# Patient Record
Sex: Male | Born: 1985 | Race: Black or African American | Hispanic: No | Smoking: Current every day smoker
Health system: Southern US, Community
[De-identification: ages and names within clinical notes are randomized; demographics above are authoritative.]

---
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
## 2013-11-06 ENCOUNTER — Encounter (HOSPITAL_COMMUNITY): Payer: Self-pay | Admitting: Emergency Medicine

## 2013-11-06 ENCOUNTER — Emergency Department (HOSPITAL_COMMUNITY)
Admission: EM | Admit: 2013-11-06 | Discharge: 2013-11-06 | Discharge status: Against Medical Advice | Payer: Self-pay | Attending: Emergency Medicine | Admitting: Emergency Medicine

## 2013-11-06 DIAGNOSIS — F17200 Nicotine dependence, unspecified, uncomplicated: Secondary | ICD-10-CM | POA: Insufficient documentation

## 2013-11-06 DIAGNOSIS — H579 Unspecified disorder of eye and adnexa: Secondary | ICD-10-CM | POA: Insufficient documentation

## 2013-11-06 DIAGNOSIS — Z5329 Procedure and treatment not carried out because of patient's decision for other reasons: Secondary | ICD-10-CM

## 2013-11-06 DIAGNOSIS — Z5320 Procedure and treatment not carried out because of patient's decision for unspecified reasons: Secondary | ICD-10-CM

## 2013-11-06 DIAGNOSIS — J029 Acute pharyngitis, unspecified: Secondary | ICD-10-CM | POA: Insufficient documentation

## 2013-11-06 DIAGNOSIS — R059 Cough, unspecified: Secondary | ICD-10-CM | POA: Insufficient documentation

## 2013-11-06 DIAGNOSIS — R05 Cough: Secondary | ICD-10-CM | POA: Insufficient documentation

## 2013-11-06 MED ORDER — TETRACAINE HCL 0.5 % OP SOLN
2.0000 [drp] | Freq: Once | OPHTHALMIC | Status: AC
  Administered 2013-11-06: 2 [drp] via OPHTHALMIC
  Filled 2013-11-06: qty 2

## 2013-11-06 MED ORDER — FLUORESCEIN SODIUM 1 MG OP STRP
1.0000 | ORAL_STRIP | Freq: Once | OPHTHALMIC | Status: AC
  Administered 2013-11-06: 1 via OPHTHALMIC
  Filled 2013-11-06: qty 1

## 2013-11-06 NOTE — ED Notes (Signed)
Pt states sore throat x 3 days, productive cough. Pt noticed some exudate from left eye and wanted that checked out also. He states his kids have pink eye.

## 2013-11-06 NOTE — ED Notes (Signed)
Eye gtt instilled at left eye .

## 2013-11-06 NOTE — ED Notes (Signed)
Pt. informed nurse that he has to leave " 2 kids at home and snowing ..." . PA notified . Pt. Did not sign AMA form .

## 2013-11-07 NOTE — ED Provider Notes (Signed)
Patient left without being evaluated  Randall Munchobert [name], MD 11/07/13 [phone]

## 2013-11-07 NOTE — ED Provider Notes (Signed)
Patient left prior to being seen.    Rudene AndaJacob Gray Ameliana Brashear, PA-C 11/07/13 [phone]

## 2014-07-02 ENCOUNTER — Emergency Department (INDEPENDENT_AMBULATORY_CARE_PROVIDER_SITE_OTHER)
Admission: EM | Admit: 2014-07-02 | Discharge: 2014-07-02 | Disposition: A | Discharge status: Home / Self Care | Payer: Self-pay | Source: Home / Self Care | Attending: Emergency Medicine | Admitting: Emergency Medicine

## 2014-07-02 ENCOUNTER — Encounter (HOSPITAL_COMMUNITY): Payer: Self-pay | Admitting: Emergency Medicine

## 2014-07-02 DIAGNOSIS — R69 Illness, unspecified: Principal | ICD-10-CM

## 2014-07-02 DIAGNOSIS — J111 Influenza due to unidentified influenza virus with other respiratory manifestations: Secondary | ICD-10-CM

## 2014-07-02 LAB — POCT RAPID STREP A: STREPTOCOCCUS, GROUP A SCREEN (DIRECT): NEGATIVE

## 2014-07-02 MED ORDER — IPRATROPIUM BROMIDE 0.06 % NA SOLN: 2.0000 | Freq: Four times a day (QID) | NASAL | Status: DC

## 2014-07-02 MED ORDER — TRAMADOL HCL 50 MG PO TABS: 100.0000 mg | ORAL_TABLET | Freq: Three times a day (TID) | ORAL | Status: DC | PRN

## 2014-07-02 MED ORDER — GUAIFENESIN-CODEINE 100-10 MG/5ML PO SYRP: 5.0000 mL | ORAL_SOLUTION | Freq: Three times a day (TID) | ORAL | Status: DC | PRN

## 2014-07-02 NOTE — ED Provider Notes (Signed)
  Chief Complaint   URI   History of Present Illness   Randall Raymond is a 28 year old male who's had a three-day history of dry cough, chest tightness, chest pain with inspiration, sore throat, chills, sweats, subjective fever, nasal congestion and clear rhinorrhea, headache, and sinus pressure. He denies any earache or GI symptoms. His mouth is so dry. His daughter had similar symptoms.  Review of Systems   Other than as noted above, the patient denies any of the following symptoms: Systemic:  No fevers, chills, sweats, or myalgias. Eye:  No redness or discharge. ENT:  No ear pain, headache, nasal congestion, drainage, sinus pressure, or sore throat. Neck:  No neck pain, stiffness, or swollen glands. Lungs:  No cough, sputum production, hemoptysis, wheezing, chest tightness, shortness of breath or chest pain. GI:  No abdominal pain, nausea, vomiting or diarrhea.  PMFSH   Past medical history, family history, social history, meds, and allergies were reviewed.   Physical exam   Vital signs:  BP 114/83  Pulse 86  Temp(Src) 99.4 F (37.4 C) (Oral)  Resp 20  SpO2 97% General:  Alert and oriented.  In no distress.  Skin warm and dry. Eye:  No conjunctival injection or drainage. Lids were normal. ENT:  TMs and canals were normal, without erythema or inflammation.  Nasal mucosa was clear and uncongested, without drainage.  Mucous membranes were moist.  Tonsils were enlarged and red but without exudate.  There were no oral ulcerations or lesions. Neck:  Supple, no adenopathy, tenderness or mass. Lungs:  No respiratory distress.  Lungs were clear to auscultation, without wheezes, rales or rhonchi.  Breath sounds were clear and equal bilaterally.  Heart:  Regular rhythm, without gallops, murmers or rubs. Skin:  Clear, warm, and dry, without rash or lesions.  Labs   Results for orders placed during the hospital encounter of 07/02/14  POCT RAPID STREP A ([facility] ONLY)      Result  Value Ref Range   Streptococcus, Group A Screen (Direct) NEGATIVE  NEGATIVE    Assessment     The encounter diagnosis was Influenza-like illness.  Plan    1.  Meds:  The following meds were prescribed:   New Prescriptions   GUAIFENESIN-CODEINE (ROBITUSSIN AC) 100-10 MG/5ML SYRUP    Take 5 mLs by mouth 3 (three) times daily as needed for cough.   IPRATROPIUM (ATROVENT) 0.06 % NASAL SPRAY    Place 2 sprays into both nostrils 4 (four) times daily.   TRAMADOL (ULTRAM) 50 MG TABLET    Take 2 tablets (100 mg total) by mouth every 8 (eight) hours as needed.    2.  Patient Education/Counseling:  The patient was given appropriate handouts, self care instructions, and instructed in symptomatic relief.  Instructed to get extra fluids and extra rest.    3.  Follow up:  The patient was told to follow up here if no better in 3 to 4 days, or sooner if becoming worse in any way, and given some red flag symptoms such as increasing fever, difficulty breathing, chest pain, or persistent vomiting which would prompt immediate return.       Reuben Likesavid C Keyli Duross, MD 07/02/14 [phone]

## 2014-07-02 NOTE — ED Notes (Signed)
C/o 3 day duration of cough, congestion, discomfort in ribs . "coughing up a lung every time he smokes"

## 2014-07-02 NOTE — Discharge Instructions (Signed)

## 2014-07-03 LAB — CULTURE, GROUP A STREP

## 2014-07-04 ENCOUNTER — Telehealth (HOSPITAL_COMMUNITY): Payer: Self-pay | Admitting: Emergency Medicine

## 2014-07-04 MED ORDER — AMOXICILLIN 500 MG PO CAPS: 500.0000 mg | ORAL_CAPSULE | Freq: Three times a day (TID) | ORAL | Status: DC

## 2014-07-04 NOTE — ED Notes (Signed)
His strep culture was positive for Strep pyogenes.  He was not treated.  Prescription for Amoxicillin 500 mg, #30, 1 TID e-scribed to [facility].  Please call patient and inform him of result.  Make sure he knows to finish up entire prescription of antibiotics and to take infectious precautions.  Randall Raymond C Chenoah Mcnally, MD 07/04/14 [phone]

## 2014-07-05 ENCOUNTER — Telehealth (HOSPITAL_COMMUNITY): Payer: Self-pay | Admitting: *Deleted

## 2014-07-05 NOTE — ED Notes (Addendum)
Throat culture: Group A Strep (S. Pyogenes).  Dr. Lorenz CoasterKeller e-prescribed Amoxicillin.  I called pt. and left a message to call. Call 1. Vassie MoselleYork, Adyan Palau M 07/05/2014 I called pt.  Pt. verified x 2 and given results. Pt.told he needs Amoxicillin for strep throat and told where to pick up his Rx.  Pt. instructed to finish all of the medication and comeback if not better.  Pt. voiced understanding. Vassie MoselleYork, Darcy Barbara M 07/08/2014

## 2014-11-10 ENCOUNTER — Encounter (HOSPITAL_COMMUNITY): Payer: Self-pay | Admitting: *Deleted

## 2014-11-10 ENCOUNTER — Emergency Department (HOSPITAL_COMMUNITY)
Admission: EM | Admit: 2014-11-10 | Discharge: 2014-11-10 | Discharge status: Against Medical Advice | Payer: Self-pay | Attending: Emergency Medicine | Admitting: Emergency Medicine

## 2014-11-10 DIAGNOSIS — Y9389 Activity, other specified: Secondary | ICD-10-CM | POA: Insufficient documentation

## 2014-11-10 DIAGNOSIS — Z720 Tobacco use: Secondary | ICD-10-CM | POA: Insufficient documentation

## 2014-11-10 DIAGNOSIS — R1011 Right upper quadrant pain: Secondary | ICD-10-CM | POA: Insufficient documentation

## 2014-11-10 DIAGNOSIS — Y9289 Other specified places as the place of occurrence of the external cause: Secondary | ICD-10-CM | POA: Insufficient documentation

## 2014-11-10 DIAGNOSIS — R0602 Shortness of breath: Secondary | ICD-10-CM | POA: Insufficient documentation

## 2014-11-10 DIAGNOSIS — K1379 Other lesions of oral mucosa: Secondary | ICD-10-CM | POA: Insufficient documentation

## 2014-11-10 DIAGNOSIS — Y998 Other external cause status: Secondary | ICD-10-CM | POA: Insufficient documentation

## 2014-11-10 DIAGNOSIS — S59902A Unspecified injury of left elbow, initial encounter: Secondary | ICD-10-CM | POA: Insufficient documentation

## 2014-11-10 DIAGNOSIS — S6992XA Unspecified injury of left wrist, hand and finger(s), initial encounter: Secondary | ICD-10-CM | POA: Insufficient documentation

## 2014-11-10 DIAGNOSIS — S299XXA Unspecified injury of thorax, initial encounter: Secondary | ICD-10-CM | POA: Insufficient documentation

## 2014-11-10 DIAGNOSIS — W1830XA Fall on same level, unspecified, initial encounter: Secondary | ICD-10-CM | POA: Insufficient documentation

## 2014-11-10 LAB — CBC WITH DIFFERENTIAL/PLATELET
Basophils Absolute: 0 10*3/uL (ref 0.0–0.1)
Basophils Relative: 1 % (ref 0–1)
Eosinophils Absolute: 0.2 10*3/uL (ref 0.0–0.7)
Eosinophils Relative: 3 % (ref 0–5)
HCT: 43.5 % (ref 39.0–52.0)
Hemoglobin: 15.2 g/dL (ref 13.0–17.0)
Lymphocytes Relative: 31 % (ref 12–46)
Lymphs Abs: 1.9 10*3/uL (ref 0.7–4.0)
MCH: 30.4 pg (ref 26.0–34.0)
MCHC: 34.9 g/dL (ref 30.0–36.0)
MCV: 87 fL (ref 78.0–100.0)
MONOS PCT: 12 % (ref 3–12)
Monocytes Absolute: 0.7 10*3/uL (ref 0.1–1.0)
NEUTROS ABS: 3.3 10*3/uL (ref 1.7–7.7)
NEUTROS PCT: 55 % (ref 43–77)
PLATELETS: 277 10*3/uL (ref 150–400)
RBC: 5 MIL/uL (ref 4.22–5.81)
RDW: 13.4 % (ref 11.5–15.5)
WBC: 6.1 10*3/uL (ref 4.0–10.5)

## 2014-11-10 LAB — COMPREHENSIVE METABOLIC PANEL
ALT: 41 U/L (ref 0–53)
AST: 74 U/L — ABNORMAL HIGH (ref 0–37)
Albumin: 4.7 g/dL (ref 3.5–5.2)
Alkaline Phosphatase: 73 U/L (ref 39–117)
Anion gap: 8 (ref 5–15)
BUN: 13 mg/dL (ref 6–23)
CO2: 25 mmol/L (ref 19–32)
Calcium: 9.3 mg/dL (ref 8.4–10.5)
Chloride: 107 mmol/L (ref 96–112)
Creatinine, Ser: 0.95 mg/dL (ref 0.50–1.35)
GFR calc Af Amer: >90 mL/min (ref >90)
GFR calc non Af Amer: >90 mL/min (ref >90)
Glucose, Bld: 110 mg/dL — ABNORMAL HIGH (ref 70–99)
Potassium: 3.3 mmol/L — ABNORMAL LOW (ref 3.5–5.1)
Sodium: 140 mmol/L (ref 135–145)
Total Bilirubin: 1.1 mg/dL (ref 0.3–1.2)
Total Protein: 7.9 g/dL (ref 6.0–8.3)

## 2014-11-10 LAB — LIPASE, BLOOD: Lipase: 43 U/L (ref 11–59)

## 2014-11-10 NOTE — ED Notes (Signed)
Pt left without being seen.

## 2014-11-10 NOTE — ED Notes (Addendum)
Pt reports RUQ pain since Sunday with nausea-worse when he moves around.  Denies diarrhea at this time.  Worse when coughing.  Pt also reports fall x 2 days ago, reports L wrsit pain and L elbow pain.  No deformity or swelling to wrist or elbow at this time.  Pt also reports sore in his L mouth.  States that he had sore throat as well and had taken left over amoxicillin and is now feeling better.

## 2014-12-10 DIAGNOSIS — Z79899 Other long term (current) drug therapy: Secondary | ICD-10-CM | POA: Insufficient documentation

## 2014-12-10 DIAGNOSIS — Z720 Tobacco use: Secondary | ICD-10-CM | POA: Insufficient documentation

## 2014-12-10 DIAGNOSIS — Z792 Long term (current) use of antibiotics: Secondary | ICD-10-CM | POA: Insufficient documentation

## 2014-12-10 DIAGNOSIS — K029 Dental caries, unspecified: Secondary | ICD-10-CM | POA: Insufficient documentation

## 2014-12-10 DIAGNOSIS — K088 Other specified disorders of teeth and supporting structures: Secondary | ICD-10-CM | POA: Insufficient documentation

## 2014-12-11 ENCOUNTER — Encounter (HOSPITAL_COMMUNITY): Payer: Self-pay | Admitting: *Deleted

## 2014-12-11 ENCOUNTER — Emergency Department (HOSPITAL_COMMUNITY)
Admission: EM | Admit: 2014-12-11 | Discharge: 2014-12-11 | Disposition: A | Discharge status: Home / Self Care | Payer: Self-pay | Attending: Emergency Medicine | Admitting: Emergency Medicine

## 2014-12-11 DIAGNOSIS — K0889 Other specified disorders of teeth and supporting structures: Secondary | ICD-10-CM

## 2014-12-11 MED ORDER — TRAMADOL HCL 50 MG PO TABS: 50.0000 mg | ORAL_TABLET | Freq: Four times a day (QID) | ORAL | Status: DC | PRN

## 2014-12-11 MED ORDER — PENICILLIN V POTASSIUM 500 MG PO TABS: 500.0000 mg | ORAL_TABLET | Freq: Four times a day (QID) | ORAL | Status: AC

## 2014-12-11 NOTE — ED Notes (Signed)
C/o pain to mouth x 3 hours. States he has cavities and broken teeth and he needs to go to the dentist. States that he took ibuprofen with no relief.

## 2014-12-11 NOTE — ED Provider Notes (Signed)
CSN: [id number]     Arrival date & time 12/10/14  2349 History   First MD Initiated Contact with Patient 12/11/14 0021     Chief Complaint  Patient presents with  . Dental Pain     (Consider location/radiation/quality/duration/timing/severity/associated sxs/prior Treatment) HPI Comments: Patient presents today with right lower dental pain.  Pain has been present for the past 3 hours and is gradually worsening.  He denies acute injury or trauma.  He states that he took 4 Ibuprofen, but does not feel that it helped.  He reports that he does not have a dentist.  No fever, chills, difficulty swallowing, nausea, vomiting, or facial swelling.    The history is provided by the patient.    History reviewed. No pertinent past medical history. History reviewed. No pertinent past surgical history. No family history on file. History  Substance Use Topics  . Smoking status: Current Every Day Smoker -- 0.25 packs/day    Types: Cigarettes  . Smokeless tobacco: Never Used  . Alcohol Use: 1.8 oz/week    3 Cans of beer per week    Review of Systems  All other systems reviewed and are negative.     Allergies  Review of patient's allergies indicates no known allergies.  Home Medications   Prior to Admission medications   Medication Sig Start Date End Date Taking? Authorizing Provider  amoxicillin (AMOXIL) 500 MG capsule Take 1 capsule (500 mg total) by mouth 3 (three) times daily. 07/04/14   Reuben Likes, MD  guaiFENesin-codeine [facility]) 100-10 MG/5ML syrup Take 5 mLs by mouth 3 (three) times daily as needed for cough. 07/02/14   Reuben Likes, MD  ipratropium (ATROVENT) 0.06 % nasal spray Place 2 sprays into both nostrils 4 (four) times daily. 07/02/14   Reuben Likes, MD  Pseudoeph-Doxylamine-DM-APAP 60-12.02-08-999 MG/30ML LIQD Take 30 mLs by mouth daily as needed (for cold).    Historical Provider, MD  traMADol (ULTRAM) 50 MG tablet Take 2 tablets (100 mg total) by mouth every  8 (eight) hours as needed. 07/02/14   Reuben Likes, MD   BP 146/87 mmHg  Pulse 74  Temp(Src) 98.6 F (37 C)  Resp 18  Ht  (1.727 m)  Wt 180 lb (81.647 kg)  BMI 27.38 kg/m2  SpO2 98% Physical Exam  Constitutional: He appears well-developed and well-nourished.  HENT:  Head: Normocephalic and atraumatic.  Mouth/Throat: Oropharynx is clear and moist.  Tenderness to palpation and mild swelling of the right lower gingiva Dental decay of the right lower teeth No obvious dental abscess No sublingual tenderness or swelling No trismus  Neck: Normal range of motion. Neck supple.  Cardiovascular: Normal rate, regular rhythm and normal heart sounds.   Pulmonary/Chest: Effort normal and breath sounds normal.  Musculoskeletal: Normal range of motion.  Lymphadenopathy:       Head (right side): No submental and no submandibular adenopathy present.       Head (left side): No submental and no submandibular adenopathy present.  Neurological: He is alert.  Skin: Skin is warm and dry.  Nursing note and vitals reviewed.   ED Course  Procedures (including critical care time) Labs Review Labs Reviewed - No data to display  Imaging Review No results found.   EKG Interpretation None      MDM   Final diagnoses:  None   Patient with dental pain.  No gross abscess.  Exam unconcerning for Ludwig's angina or spread of infection.  Will treat with  penicillin and pain medicine.  Urged patient to follow-up with dentist.  Stable for discharge.  Return precautions given.     Santiago GladHeather Ismeal Heider, PA-C 12/11/14 0110  Purvis SheffieldForrest Harrison, MD 12/11/14 [phone]

## 2014-12-11 NOTE — Discharge Instructions (Signed)
Take pain medication as needed for severe pain.  Do not drive or operate heavy machinery for 4-6 hours after taking medication  Dental Pain A tooth ache may be caused by cavities (tooth decay). Cavities expose the nerve of the tooth to air and hot or cold temperatures. It may come from an infection or abscess (also called a boil or furuncle) around your tooth. It is also often caused by dental caries (tooth decay). This causes the pain you are having. DIAGNOSIS  Your caregiver can diagnose this problem by exam. TREATMENT   If caused by an infection, it may be treated with medications which kill germs (antibiotics) and pain medications as prescribed by your caregiver. Take medications as directed.  Only take over-the-counter or prescription medicines for pain, discomfort, or fever as directed by your caregiver.  Whether the tooth ache today is caused by infection or dental disease, you should see your dentist as soon as possible for further care. SEEK MEDICAL CARE IF: The exam and treatment you received today has been provided on an emergency basis only. This is not a substitute for complete medical or dental care. If your problem worsens or new problems (symptoms) appear, and you are unable to meet with your dentist, call or return to this location. SEEK IMMEDIATE MEDICAL CARE IF:   You have a fever.  You develop redness and swelling of your face, jaw, or neck.  You are unable to open your mouth.  You have severe pain uncontrolled by pain medicine. MAKE SURE YOU:   Understand these instructions.  Will watch your condition.  Will get help right away if you are not doing well or get worse. Document Released: 08/29/2005 Document Revised: 11/21/2011 Document Reviewed: 04/16/2008 [facility] Patient Information 2015 [place], [place]. This information is not intended to replace advice given to you by your health care provider. Make sure you discuss any questions you have with your health care  provider.

## 2015-05-11 ENCOUNTER — Encounter (HOSPITAL_COMMUNITY): Payer: Self-pay | Admitting: Emergency Medicine

## 2015-05-11 ENCOUNTER — Emergency Department (HOSPITAL_COMMUNITY)
Admission: EM | Admit: 2015-05-11 | Discharge: 2015-05-11 | Disposition: A | Discharge status: Home / Self Care | Payer: Self-pay | Attending: Emergency Medicine | Admitting: Emergency Medicine

## 2015-05-11 DIAGNOSIS — Z720 Tobacco use: Secondary | ICD-10-CM | POA: Insufficient documentation

## 2015-05-11 DIAGNOSIS — Z23 Encounter for immunization: Secondary | ICD-10-CM | POA: Insufficient documentation

## 2015-05-11 DIAGNOSIS — Z79899 Other long term (current) drug therapy: Secondary | ICD-10-CM | POA: Insufficient documentation

## 2015-05-11 DIAGNOSIS — L02416 Cutaneous abscess of left lower limb: Secondary | ICD-10-CM | POA: Insufficient documentation

## 2015-05-11 MED ORDER — TETANUS-DIPHTH-ACELL PERTUSSIS 5-2.5-18.5 LF-MCG/0.5 IM SUSP
0.5000 mL | Freq: Once | INTRAMUSCULAR | Status: AC
  Administered 2015-05-11: 0.5 mL via INTRAMUSCULAR
  Filled 2015-05-11: qty 0.5

## 2015-05-11 MED ORDER — DOXYCYCLINE HYCLATE 100 MG PO CAPS: 100.0000 mg | ORAL_CAPSULE | Freq: Two times a day (BID) | ORAL | Status: DC

## 2015-05-11 NOTE — ED Notes (Signed)
Pt with left knee abscess; pt sts some purulent drainage

## 2015-05-11 NOTE — ED Notes (Signed)
Pt stable, ambulatory, states understanding of discharge instructions 

## 2015-05-11 NOTE — ED Provider Notes (Signed)
CSN: [id number]     Arrival date & time 05/11/15  1458 History   First MD Initiated Contact with Patient 05/11/15 1559     Chief Complaint  Patient presents with  . Abscess     HPI   Randall Raymond is a 29 y.o. male with no significant PMH who presents to the ED with left knee abscess. Reports he was bitten by something 2 weeks ago and subsequently noticed an abscess to his left knee. He reports his abscess developed a white head, so he drained it, used hydrogen peroxide to clean it, and applied neosporin. He states this abscess seems to be healing. He reports he developed another abscess to his left knee 2 days ago, and that it has grown in size since that time. He denies drainage. He has not tried anything for symptom relief. He denies fever, chills, difficulty moving his left lower extremity, numbness, paresthesia.   History reviewed. No pertinent past medical history. History reviewed. No pertinent past surgical history. History reviewed. No pertinent family history. Social History  Substance Use Topics  . Smoking status: Current Every Day Smoker -- 0.25 packs/day    Types: Cigarettes  . Smokeless tobacco: Never Used  . Alcohol Use: 1.8 oz/week    3 Cans of beer per week    Review of Systems  Constitutional: Negative for fever, chills, activity change, appetite change and fatigue.  HENT: Negative for congestion.   Eyes: Negative for visual disturbance.  Respiratory: Negative for cough and shortness of breath.   Cardiovascular: Negative for chest pain, palpitations and leg swelling.  Gastrointestinal: Negative for nausea, vomiting, abdominal pain, diarrhea, constipation and abdominal distention.  Genitourinary: Negative for dysuria, urgency and frequency.  Musculoskeletal: Negative for myalgias, back pain, arthralgias, neck pain and neck stiffness.  Skin: Positive for wound. Negative for color change, pallor and rash.       Abscess to left knee  Neurological: Negative for  dizziness, syncope, weakness, light-headedness, numbness and headaches.  All other systems reviewed and are negative.     Allergies  Review of patient's allergies indicates no known allergies.  Home Medications   Prior to Admission medications   Medication Sig Start Date End Date Taking? Authorizing Provider  amoxicillin (AMOXIL) 500 MG capsule Take 1 capsule (500 mg total) by mouth 3 (three) times daily. 07/04/14   Reuben Likes, MD  doxycycline (VIBRAMYCIN) 100 MG capsule Take 1 capsule (100 mg total) by mouth 2 (two) times daily. 05/11/15   Mady Gemma, PA-C  guaiFENesin-codeine (ROBITUSSIN AC) 100-10 MG/5ML syrup Take 5 mLs by mouth 3 (three) times daily as needed for cough. 07/02/14   Reuben Likes, MD  ipratropium (ATROVENT) 0.06 % nasal spray Place 2 sprays into both nostrils 4 (four) times daily. 07/02/14   Reuben Likes, MD  Pseudoeph-Doxylamine-DM-APAP 60-12.02-08-999 MG/30ML LIQD Take 30 mLs by mouth daily as needed (for cold).    Historical Provider, MD  traMADol (ULTRAM) 50 MG tablet Take 1 tablet (50 mg total) by mouth every 6 (six) hours as needed. 12/11/14   Heather Laisure, PA-C    BP 114/73 mmHg  Pulse 61  Temp(Src) 97.9 F (36.6 C) (Oral)  Resp 20  SpO2 99% Physical Exam  Constitutional: He is oriented to person, place, and time. He appears well-developed and well-nourished. No distress.  HENT:  Head: Normocephalic and atraumatic.  Right Ear: External ear normal.  Left Ear: External ear normal.  Nose: Nose normal.  Mouth/Throat: Uvula is midline, oropharynx  is clear and moist and mucous membranes are normal.  Eyes: Conjunctivae, EOM and lids are normal. Pupils are equal, round, and reactive to light. Right eye exhibits no discharge. Left eye exhibits no discharge. No scleral icterus.  Neck: Normal range of motion. Neck supple.  Cardiovascular: Normal rate, regular rhythm, normal heart sounds, intact distal pulses and normal pulses.   Pulmonary/Chest:  Effort normal and breath sounds normal. No respiratory distress.  Abdominal: Soft. Normal appearance and bowel sounds are normal. He exhibits no distension and no mass. There is no tenderness. There is no rigidity, no rebound and no guarding.  Musculoskeletal: Normal range of motion. He exhibits no edema or tenderness.  Neurological: He is alert and oriented to person, place, and time. He has normal strength. No cranial nerve deficit or sensory deficit.  Skin: Skin is warm, dry and intact. No rash noted. He is not diaphoretic. No erythema. No pallor.  1 cm abscess to anterior inferior aspect of left knee with small area of surrounding erythema, mildly TTP, fluctuant. 1 cm area of induration to anterior superior aspect of left knee with scab.   Psychiatric: He has a normal mood and affect. His speech is normal and behavior is normal. Judgment and thought content normal.  Nursing note and vitals reviewed.   ED Course  INCISION AND DRAINAGE Date/Time: 05/11/2015 5:38 PM Performed by: Glean Hess C Authorized by: Glean Hess C Consent: Verbal consent obtained. Risks and benefits: risks, benefits and alternatives were discussed Consent given by: patient Patient understanding: patient states understanding of the procedure being performed Patient consent: the patient's understanding of the procedure matches consent given Procedure consent: procedure consent matches procedure scheduled Relevant documents: relevant documents present and verified Site marked: the operative site was marked Required items: required blood products, implants, devices, and special equipment available Patient identity confirmed: verbally with patient and arm band Time out: Immediately prior to procedure a "time out" was called to verify the correct patient, procedure, equipment, support staff and site/side marked as required. Type: abscess Body area: lower extremity Location details: left leg Patient  sedated: no Needle gauge: 18 Incision depth: dermal Complexity: simple Drainage: purulent Drainage amount: scant Wound treatment: wound left open Packing material: none Patient tolerance: Patient tolerated the procedure well with no immediate complications Comments: Lesion cleaned with iodine, sprayed with cold spray, and incised with 18 gauge needle.   (including critical care time)  Labs Review Labs Reviewed - No data to display  Imaging Review No results found.    EKG Interpretation None      MDM   Final diagnoses:  Abscess of knee, left    29 year old male presents with abscess to left knee x 2 days. Denies fever, chills, difficulty moving his left lower extremity, numbness, paresthesia.   Patient is afebrile. Vital signs stable. 1 cm fluctuant mass with surrounding erythema and mild TTP over left knee. Full range of motion of bilateral lower extremities.  Incision and drainage performed in the ED. Patient to be discharged with doxycycline x 10 days. Return precautions discussed. Patient to follow-up with PCP.  BP 114/73 mmHg  Pulse 61  Temp(Src) 97.9 F (36.6 C) (Oral)  Resp 20  SpO2 99%      Mady Gemma, PA-C 05/11/15 2049  Marily Memos, MD 05/14/15 1524

## 2015-05-11 NOTE — Discharge Instructions (Signed)
1. Medications: doxycycline, usual home medications 2. Treatment: rest, drink plenty of fluids 3. Follow Up: please followup with your primary doctor this week for discussion of your diagnoses and further evaluation after today's visit; if you do not have a primary care doctor use the resource guide provided to find one; please return to the ER for severe pain, redness, swelling, heat to knee, new or worsening symptoms   Abscess An abscess (boil or furuncle) is an infected area on or under the skin. This area is filled with yellowish-white fluid (pus) and other material (debris). HOME CARE   Only take medicines as told by your doctor.  If you were given antibiotic medicine, take it as directed. Finish the medicine even if you start to feel better.  If gauze is used, follow your doctor's directions for changing the gauze.  To avoid spreading the infection:  Keep your abscess covered with a bandage.  Wash your hands well.  Do not share personal care items, towels, or whirlpools with others.  Avoid skin contact with others.  Keep your skin and clothes clean around the abscess.  Keep all doctor visits as told. GET HELP RIGHT AWAY IF:   You have more pain, puffiness (swelling), or redness in the wound site.  You have more fluid or blood coming from the wound site.  You have muscle aches, chills, or you feel sick.  You have a fever. MAKE SURE YOU:   Understand these instructions.  Will watch your condition.  Will get help right away if you are not doing well or get worse. Document Released: 02/15/2008 Document Revised: 02/28/2012 Document Reviewed: 11/11/2011 [facility] Patient Information 2015 [place], [place]. This information is not intended to replace advice given to you by your health care provider. Make sure you discuss any questions you have with your health care provider.  Abscess Care After An abscess (also called a boil or furuncle) is an infected area that contains  a collection of pus. Signs and symptoms of an abscess include pain, tenderness, redness, or hardness, or you may feel a moveable soft area under your skin. An abscess can occur anywhere in the body. The infection may spread to surrounding tissues causing cellulitis. A cut (incision) by the surgeon was made over your abscess and the pus was drained out. Gauze may have been packed into the space to provide a drain that will allow the cavity to heal from the inside outwards. The boil may be painful for 5 to 7 days. Most people with a boil do not have high fevers. Your abscess, if seen early, may not have localized, and may not have been lanced. If not, another appointment may be required for this if it does not get better on its own or with medications. HOME CARE INSTRUCTIONS   Only take over-the-counter or prescription medicines for pain, discomfort, or fever as directed by your caregiver.  When you bathe, soak and then remove gauze or iodoform packs at least daily or as directed by your caregiver. You may then wash the wound gently with mild soapy water. Repack with gauze or do as your caregiver directs. SEEK IMMEDIATE MEDICAL CARE IF:   You develop increased pain, swelling, redness, drainage, or bleeding in the wound site.  You develop signs of generalized infection including muscle aches, chills, fever, or a general ill feeling.  An oral temperature above 102 F (38.9 C) develops, not controlled by medication. See your caregiver for a recheck if you develop any of the  symptoms described above. If medications (antibiotics) were prescribed, take them as directed. Document Released: 03/17/2005 Document Revised: 11/21/2011 Document Reviewed: 11/12/2007 [facility] Patient Information 2015 [place], [place]. This information is not intended to replace advice given to you by your health care provider. Make sure you discuss any questions you have with your health care provider.   Emergency Department  Resource Guide 1) Find a Doctor and Pay Out of Pocket Although you won't have to find out who is covered by your insurance plan, it is a good idea to ask around and get recommendations. You will then need to call the office and see if the doctor you have chosen will accept you as a new patient and what types of options they offer for patients who are self-pay. Some doctors offer discounts or will set up payment plans for their patients who do not have insurance, but you will need to ask so you aren't surprised when you get to your appointment.  2) Contact Your Local Health Department Not all health departments have doctors that can see patients for sick visits, but many do, so it is worth a call to see if yours does. If you don't know where your local health department is, you can check in your phone book. The CDC also has a tool to help you locate your state's health department, and many state websites also have listings of all of their local health departments.  3) Find a Walk-in Clinic If your illness is not likely to be very severe or complicated, you may want to try a walk in clinic. These are popping up all over the country in pharmacies, drugstores, and shopping centers. They're usually staffed by nurse practitioners or physician assistants that have been trained to treat common illnesses and complaints. They're usually fairly quick and inexpensive. However, if you have serious medical issues or chronic medical problems, these are probably not your best option.  No Primary Care Doctor: - Call Health Connect at  [phone] - they can help you locate a primary care doctor that  accepts your insurance, provides certain services, etc. - Physician Referral Service- [phone]  Chronic Pain Problems: Organization         Address  Phone   Notes  [facility]  [phone] Patients need to be referred by their primary care doctor.   Medication Assistance: Organization          Address  Phone   Notes  [facility] Medication [facility] [address] [place] [place], [place] [address] [phone] --Must be a resident of [facility] -- Must have NO insurance coverage whatsoever (no Medicaid/ Medicare, etc.) -- The pt. MUST have a primary care doctor that directs their care regularly and follows them in the community   [facility]ist  [phone]   [employer]  [phone]    Agencies that provide inexpensive medical care: Organization         Address  Phone   Notes  [facility]  [phone]   [facility]    [phone]   [facility] [address] [place], [place] [address] [phone]   [facility] [address] [place] [place] [phone]   Planned Parenthood    [phone]   [facility]    [phone]   [facility]  [address], [place] Phone:  [phone], Fax:  [phone] Hours of Operation:  9 am - 6 pm, M-F.  Also accepts Medicaid/Medicare and self-pay.  [facility] for [place], [address], [place] Phone: [phone], Fax: [phone]. Hours of Operation:  8:30 am - 5:30 pm, M-F.  Also accepts Medicaid and self-pay.  [place] [address], [place] Phone: [phone]   [place], [place], [place] [phone], Ext. [phone] Mondays & Thursdays: 7-9 AM.  First 15 patients are seen on a first come, first serve basis.    [place] Providers:  Organization         Address  Phone   Notes  [facility] [address], [place] [phone] Also accepts self-pay patients.  [facility] [id number] [place], [place]  [phone]   [place], [address], [place] [phone]   [facility] [address] [place] [phone]   Lucianne Lei [address], [place]   [phone] Only accepts [place] Access [place] patients after they have their name applied to their card.   Self-Pay (no insurance) in [facility]:  Organization         Address  Phone   Notes  Sickle Cell Patients, [facility] Internal Medicine [place] [phone]   [facility] [place] [phone]   [facility] [place] [place] [facility]  [place], [place], [place] [phone]   [facility]/Dr. Osei-Bonsu  [address] [place] or [address], [place] [phone] Phone number for both [place] and [place] locations is the same.  Urgent Medical and [facility] [address], [place] [phone]   [facility] [address] [place] or [address] [phone]   [facility] [address], [place] [phone], phone; [phone], fax Sees patients 1st and 3rd Saturday of every month.  Must not qualify for public or private insurance (i.e. Medicaid, Medicare, [place] Health Choice, Veterans' Benefits)  Household income should be no more than 200% of the poverty level The clinic cannot treat you if you are pregnant or think you are pregnant  Sexually transmitted diseases are not treated at the clinic.    Dental Care: Organization         Address  Phone  Notes  [facility] Department of [facility] [phone] Accepts children up to age 17 who are enrolled in [place] or [place]; pregnant women with a Medicaid card; and children who have applied for Medicaid or [place] Health Choice, but were declined, whose parents can pay a reduced fee at time of service.  [facility] Department of [facility]  [address], [place] [phone] Accepts children up to age 35 who are enrolled in [place] or [place]; pregnant women with a Medicaid card; and children who have applied for Medicaid or [place] Health Choice, but were declined, whose parents can pay a reduced fee at time of service.  [facility] Access PRO[facility]  [place] [phone] Patients are seen by appointment only. Walk-ins are not accepted. [facility] will see patients 36 years of age and older. Monday - Tuesday (8am-5pm) Most Wednesdays (8:30-5pm) $30 per visit, cash only  [facility] Adult Dental Access PROGRAM  [address], [facility] [phone] Patients are seen by appointment only. Walk-ins are not accepted. [place] will see patients 23 years of age and older. One Wednesday Evening (Monthly: Volunteer Based).  $30 per visit, cash only  [place]  [phone] for adults; Children under age 25, call Graduate Pediatric Dentistry at [phone]. Children aged 96-14, please call [phone] to request a pediatric application.  Dental services are provided in all areas of dental care including fillings, crowns and bridges, complete and partial dentures, implants, gum treatment, root canals, and extractions. Preventive care is also provided. Treatment is provided to both adults and children. Patients are selected via a lottery and there is often a waiting list.   [facility] [address] [place]  [phone] [email or web address]   [facility] [address] [place], [place] [phone], Ext. [phone] Second and Fourth Thursday of each month, opens at 6:30 AM; Clinic ends at 9 AM.  Patients are seen on a first-come first-served basis, and a limited number are seen during each clinic.   [facility]  [address] Hillard Danker [place], [place] [phone]   Eligibility Requirements You must  have lived in [place], [place], or [place] counties for at least the last three months.   You cannot be eligible for state or federal sponsored [employer], including [employer], [place], or [employer].   You generally cannot be eligible for healthcare insurance through your employer.    How to apply: Eligibility screenings are held every Tuesday and Wednesday afternoon from 1:00 pm until 4:00 pm. You do not need an appointment for the interview!  [facility] [address], [place], [place]   [place]  [place] Department  [place]  [phone]    Behavioral Health Resources in the Community: Intensive Outpatient Programs Organization         Address  Phone  Notes  [place]. [address] [place], [place] [phone]   [facility] [address], [place], [place]   ADS: [facility] [address], [place], [place]   [place] [address]  [place], [place] or [phone]   Substance Abuse Resources Organization         Address  Phone  Notes  Alcohol and Drug Services  [phone]   [place]  [phone]   [facility] [place]   Chinita Pester  [phone]   Residential & Outpatient Substance Abuse Program  [phone]   Psychological Services Organization         Address  Phone  Notes  [facility] [place]  [place]  [phone]   [place] [address] [place] or [phone]    Mobile Crisis Teams Organization         Address  Phone  Notes  [facility], Mobile Crisis Care Unit  [phone]   [facility]  [address] [place], [place]   [facility] [address] [place] [phone]    Self-Help/Support Groups Organization  Address  Phone             Notes  [place]. of [place] - variety of support groups  [place] Call for more information  Narcotics Anonymous (NA), [facility] [address], [employer] [place]  2 meetings at this location   Special educational needs teacher         Address  Phone  Notes  [facility] Residential Treatment [place],    [place]  [phone]   [facility]  [address] [place] [phone], [place], [place]   [place], [place] [phone] Admissions: 8am-3pm M-F  Incentives Substance [place] [address],    [place], [place] [phone]   [facility] [address] [place], [place], [place]   [facility] [address],  [place], [place]   [facility] - Intensive Outpatient [address] Kristeen [address], [place], [place]   [facility] [place].,  [place], [place] [phone] or [phone]   [facility] (RTS) [address], [place], [place] Accepts Medicaid  [facility] [place] [address],  [place] [phone] Substance Abuse/Addiction Treatment   [facility] Organization         Address  Phone  Notes  [facility]  [phone]   Domenic Schwab, PhD [address] Arlis [place], [place]   [phone] or [phone]   [place], [place] [phone]   [facility] [address] [place], [place] [phone] Insurance/Medicaid/sponsorship through [facility] and Families [address] [place]                                    [place], [place] [phone] [place] [address], [place] [phone]    Dr. Adele Schilder  [phone]   [facility] Dept. 1) [address] [place] 2) [place] 3)  [place] [address], [place] [phone]  [phone]   [place] [phone] or [phone] (After Hours)

## 2015-11-06 ENCOUNTER — Emergency Department (HOSPITAL_COMMUNITY)
Admission: EM | Admit: 2015-11-06 | Discharge: 2015-11-06 | Disposition: A | Discharge status: Home / Self Care | Payer: Self-pay | Attending: Emergency Medicine | Admitting: Emergency Medicine

## 2015-11-06 ENCOUNTER — Encounter (HOSPITAL_COMMUNITY): Payer: Self-pay | Admitting: Emergency Medicine

## 2015-11-06 DIAGNOSIS — F17210 Nicotine dependence, cigarettes, uncomplicated: Secondary | ICD-10-CM | POA: Insufficient documentation

## 2015-11-06 DIAGNOSIS — Z792 Long term (current) use of antibiotics: Secondary | ICD-10-CM | POA: Insufficient documentation

## 2015-11-06 DIAGNOSIS — Z79899 Other long term (current) drug therapy: Secondary | ICD-10-CM | POA: Insufficient documentation

## 2015-11-06 DIAGNOSIS — K047 Periapical abscess without sinus: Secondary | ICD-10-CM | POA: Insufficient documentation

## 2015-11-06 MED ORDER — HYDROCODONE-ACETAMINOPHEN 5-325 MG PO TABS: 1.0000 | ORAL_TABLET | ORAL | Status: DC | PRN

## 2015-11-06 MED ORDER — IBUPROFEN 800 MG PO TABS: 800.0000 mg | ORAL_TABLET | Freq: Three times a day (TID) | ORAL | Status: DC | PRN

## 2015-11-06 MED ORDER — PENICILLIN V POTASSIUM 500 MG PO TABS: 500.0000 mg | ORAL_TABLET | Freq: Four times a day (QID) | ORAL | Status: AC

## 2015-11-06 NOTE — ED Provider Notes (Signed)
CSN: [id number]     Arrival date & time 11/06/15  1827 History  By signing my name below, I, Phillis Haggis, attest that this documentation has been prepared under the direction and in the presence of Trixie Dredge, PA-C Electronically Signed: Phillis Haggis, ED Scribe. 11/06/2015. 7:54 PM.   Chief Complaint  Patient presents with  . Dental Pain   The history is provided by the patient. No language interpreter was used.  HPI Comments: Randall Raymond is a 30 y.o. male who presents to the Emergency Department complaining of gradually worsening right lower dental pain onset 3 days ago. Pt reports associated subjective fever and right facial swelling. Pt was sent home from work due to his symptoms. He believes he has an abscess to the area because "I have some bad teeth back there." He has not seen a dentist for this problem or taken anything prior to arrival. Pt denies chills, nausea, vomiting, or sore throat.  Denies difficulty swallowing or breathing.    History reviewed. No pertinent past medical history. History reviewed. No pertinent past surgical history. History reviewed. No pertinent family history. Social History  Substance Use Topics  . Smoking status: Current Every Day Smoker -- 0.25 packs/day    Types: Cigarettes  . Smokeless tobacco: Never Used  . Alcohol Use: 1.8 oz/week    3 Cans of beer per week    Review of Systems  Constitutional: Positive for fever. Negative for chills.  HENT: Positive for dental problem and facial swelling. Negative for sore throat and trouble swallowing.   Respiratory: Negative for shortness of breath, wheezing and stridor.   Gastrointestinal: Negative for nausea and vomiting.  Allergic/Immunologic: Negative for immunocompromised state.   Allergies  Review of patient's allergies indicates no known allergies.  Home Medications   Prior to Admission medications   Medication Sig Start Date End Date Taking? Authorizing Provider  amoxicillin (AMOXIL) 500  MG capsule Take 1 capsule (500 mg total) by mouth 3 (three) times daily. 07/04/14   Reuben Likes, MD  doxycycline (VIBRAMYCIN) 100 MG capsule Take 1 capsule (100 mg total) by mouth 2 (two) times daily. 05/11/15   Mady Gemma, PA-C  guaiFENesin-codeine (ROBITUSSIN AC) 100-10 MG/5ML syrup Take 5 mLs by mouth 3 (three) times daily as needed for cough. 07/02/14   Reuben Likes, MD  ipratropium (ATROVENT) 0.06 % nasal spray Place 2 sprays into both nostrils 4 (four) times daily. 07/02/14   Reuben Likes, MD  Pseudoeph-Doxylamine-DM-APAP 60-12.02-08-999 MG/30ML LIQD Take 30 mLs by mouth daily as needed (for cold).    Historical Provider, MD  traMADol (ULTRAM) 50 MG tablet Take 1 tablet (50 mg total) by mouth every 6 (six) hours as needed. 12/11/14   Heather Laisure, PA-C   BP 120/88 mmHg  Pulse 108  Temp(Src) 98.1 F (36.7 C) (Oral)  Resp 18  SpO2 98% Physical Exam  Constitutional: He appears well-developed and well-nourished. No distress.  HENT:  Head: Normocephalic and atraumatic.  Mouth/Throat: Uvula is midline and oropharynx is clear and moist. Mucous membranes are not dry. No uvula swelling. No oropharyngeal exudate, posterior oropharyngeal edema, posterior oropharyngeal erythema or tonsillar abscesses.  Right lower first molar with fluctuant abscess  Neck: Normal range of motion. Neck supple.  Cardiovascular: Normal rate.   Pulmonary/Chest: Effort normal and breath sounds normal. No stridor.  Lymphadenopathy:    He has no cervical adenopathy.  Neurological: He is alert.  Skin: He is not diaphoretic.  Nursing note and vitals reviewed.  ED Course  Procedures (including critical care time) DIAGNOSTIC STUDIES: Oxygen Saturation is 98% on RA, normal by my interpretation.    COORDINATION OF CARE: 8:08 PM-Discussed treatment plan which includes work note and anti-biotics with pt at bedside and pt agreed to plan.    Labs Review Labs Reviewed - No data to display  Imaging  Review No results found. I have personally reviewed and evaluated these images and lab results as part of my medical decision-making.   EKG Interpretation None      MDM   Final diagnoses:  Dental abscess    Afebrile, nontoxic patient with new dental pain with obvious abscess. No airway concerns.  Doubt Ludwig's angina.  D/C home with antibiotic, pain medication and dental follow up.  Discussed findings, treatment, and follow up  with patient.  Pt given return precautions.  Pt verbalizes understanding and agrees with plan.        I personally performed the services described in this documentation, which was scribed in my presence. The recorded information has been reviewed and is accurate.    Trixie Dredge, PA-C 11/06/15 2135  [place] Fossa, MD 11/07/15 1500

## 2015-11-06 NOTE — Discharge Instructions (Signed)
Read the information below.  Use the prescribed medication as directed.  Please discuss all new medications with your pharmacist.  Do not take additional tylenol while taking the prescribed pain medication to avoid overdose.  You may return to the Emergency Department at any time for worsening condition or any new symptoms that concern you.    Please call the dentist listed above within 48 hours to schedule a close follow up appointment.  If you develop fevers, swelling in your face, difficulty swallowing or breathing, return to the ER immediately for a recheck.     Dental Abscess A dental abscess is a collection of pus in or around a tooth. CAUSES This condition is caused by a bacterial infection around the root of the tooth that involves the inner part of the tooth (pulp). It may result from:  Severe tooth decay.  Trauma to the tooth that allows bacteria to enter into the pulp, such as a broken or chipped tooth.  Severe gum disease around a tooth. SYMPTOMS Symptoms of this condition include:  Severe pain in and around the infected tooth.  Swelling and redness around the infected tooth, in the mouth, or in the face.  Tenderness.  Pus drainage.  Bad breath.  Bitter taste in the mouth.  Difficulty swallowing.  Difficulty opening the mouth.  Nausea.  Vomiting.  Chills.  Swollen neck glands.  Fever. DIAGNOSIS This condition is diagnosed with examination of the infected tooth. During the exam, your dentist may tap on the infected tooth. Your dentist will also ask about your medical and dental history and may order X-rays. TREATMENT This condition is treated by eliminating the infection. This may be done with:  Antibiotic medicine.  A root canal. This may be performed to save the tooth.  Pulling (extracting) the tooth. This may also involve draining the abscess. This is done if the tooth cannot be saved. HOME CARE INSTRUCTIONS  Take medicines only as directed by your  dentist.  If you were prescribed antibiotic medicine, finish all of it even if you start to feel better.  Rinse your mouth (gargle) often with salt water to relieve pain or swelling.  Do not drive or operate heavy machinery while taking pain medicine.  Do not apply heat to the outside of your mouth.  Keep all follow-up visits as directed by your dentist. This is important. SEEK MEDICAL CARE IF:  Your pain is worse and is not helped by medicine. SEEK IMMEDIATE MEDICAL CARE IF:  You have a fever or chills.  Your symptoms suddenly get worse.  You have a very bad headache.  You have problems breathing or swallowing.  You have trouble opening your mouth.  You have swelling in your neck or around your eye.   This information is not intended to replace advice given to you by your health care provider. Make sure you discuss any questions you have with your health care provider.   Document Released: 08/29/2005 Document Revised: 01/13/2015 Document Reviewed: 08/26/2014 Elsevier Interactive Patient Education 2016 [employer].   [employer] [facility] [employer] is a great source of information about community services available.  Access by dialing 2-1-1 from anywhere in [place], or by website -  PooledIncome.pl.   Other Local Resources (Updated 09/2015)  Dental  Care   Services    Phone Number and Address  Cost   [facility] For children 83 - 26 years of age:   Cleaning  Tooth brushing/flossing  instruction  Sealants, fillings, crowns  Extractions  Emergency treatment  [phone] [address] [place], [place] [address] Charges based on family income.  Medicaid and some insurance plans accepted.     [facility] - [facility], fillings, crowns  Extractions  Emergency treatment [phone] [address] [place], [place]  Pregnant women 4  years of age or older with a Medicaid card  [facility] - [place]  Cleaning  Sealants, fillings, crowns  Extractions  Emergency treatment [phone] [address] [place], [place] Pregnant women 41 years of age or older with a Medicaid card  [facility] - [facility] For children 21 - 74 years of age:   Cleaning  Tooth brushing/flossing instruction  Sealants, fillings, crowns  Extractions  Emergency treatment Limited orthodontic services for patients with Medicaid [phone] [address] [place], [place] [address] Medicaid and [facility] Health Choice cover for children up to age 93 and pregnant women.  Parents of children up to age 11 without Medicaid pay a reduced fee at time of service.  [facility] [employer] For children 59 - 2 years of age:   Cleaning  Tooth brushing/flossing instruction  Sealants, fillings, crowns  Extractions  Emergency treatment Limited orthodontic services for patients with Medicaid [phone] [address] [place], [place].  Medicaid and [place] Health Choice cover for children up to age 45 and pregnant women.  Parents of children up to age 45 without Medicaid pay a reduced fee.  [facility]  Sealants, fillings, crowns  Extractions  Hours: Tuesdays and Thursdays, 4:15 - 8 pm [phone] [address] [place], [place] [address] Services free of charge to [facility] residents ages 18-64 who do not have health insurance, Medicare, [place], or [place] benefits and fall within federal poverty guidelines  [employer]    Provides dental care in addition to primary medical care, nutritional counseling, and pharmacy:  Nurse, mental health, fillings, crowns  Extractions                  [phone] [facility], [address] [place], [place]  [phone] [facility], [address] [place] [place], [place]  [phone] [facility] [place], [place]  [phone] [facility], [address] [place], [place]  [phone] [facility] [address] [place], [place] Accepts [place], [place], most insurance.  Also provides services available to all with fees adjusted based on ability to pay.    [facility]  Cleaning  Tooth brushing/flossing instruction  Sealants, fillings, crowns  Extractions  Emergency treatment Hours: Tuesdays, Thursdays, and Fridays from 8 am to 5 pm by appointment only. [phone] [place], [place] [address] [facility] residents with Medicaid (depending on eligibility) and children with [facility] Health Choice - call for more information.  [facility]  Extractions only  Hours: 2nd and 4th Thursday of each month from 6:30 am - 9 am.   [phone]. [phone] [place], [place] [address] Ages 63 and older only.  Patients are seen on a first come, first served basis.  [employer]  [employer]  Extractions  Orthodontics  Endodontics  Implants/Crowns/Bridges  Complete and partial dentures [phone] [place], [place] Patients  must complete an application for services.  There is often a waiting list.

## 2015-11-06 NOTE — ED Notes (Signed)
Pt sts right lower dental pain x 3 days

## 2017-11-07 ENCOUNTER — Emergency Department (HOSPITAL_COMMUNITY)
Admission: EM | Admit: 2017-11-07 | Discharge: 2017-11-07 | Disposition: A | Discharge status: Home / Self Care | Payer: Self-pay | Attending: Physician Assistant | Admitting: Physician Assistant

## 2017-11-07 ENCOUNTER — Encounter (HOSPITAL_COMMUNITY): Payer: Self-pay | Admitting: Emergency Medicine

## 2017-11-07 ENCOUNTER — Other Ambulatory Visit: Payer: Self-pay

## 2017-11-07 DIAGNOSIS — J111 Influenza due to unidentified influenza virus with other respiratory manifestations: Secondary | ICD-10-CM | POA: Insufficient documentation

## 2017-11-07 DIAGNOSIS — Z79899 Other long term (current) drug therapy: Secondary | ICD-10-CM | POA: Insufficient documentation

## 2017-11-07 DIAGNOSIS — F17210 Nicotine dependence, cigarettes, uncomplicated: Secondary | ICD-10-CM | POA: Insufficient documentation

## 2017-11-07 MED ORDER — IBUPROFEN 400 MG PO TABS
600.0000 mg | ORAL_TABLET | Freq: Once | ORAL | Status: AC
  Administered 2017-11-07: 600 mg via ORAL
  Filled 2017-11-07: qty 1

## 2017-11-07 MED ORDER — ONDANSETRON 4 MG PO TBDP: ORAL_TABLET | ORAL | 0 refills | Status: DC

## 2017-11-07 NOTE — Discharge Instructions (Signed)
You have the flu this is a viral infection that will likely start to improve after 5-7 days, antibiotics are not helpful in treating viral infections.  You may use Zofran as needed for nausea. Since your symptoms have been present for more than 2 days Tamiflu will give no additional benefit.  Please make sure you are drinking plenty of fluids. You can treat your symptoms supportively with tylenol/ibuprofen for fevers and pains, Zyrtec and Flonase to heal with nasal congestion, and over the counter cough syrups and throat lozenges to help with cough. If your symptoms are not improving please follow up with you Primary doctor.  ° °If you develop persistent fevers, shortness of breath or difficulty breathing, chest pain, severe headache and neck pain, persistent nausea and vomiting or other new or concerning symptoms return to the Emergency department. ° °

## 2017-11-07 NOTE — ED Provider Notes (Signed)
[name] EMERGENCY DEPARTMENT Provider Note   CSN: [id number] Arrival date & time: 11/07/17  1136     History   Chief Complaint Chief Complaint  Patient presents with  . Generalized Body Aches    HPI  Randall Raymond is a 32 y.o. Male is otherwise healthy, presents to the ED for evaluation of chills, generalized body aches, headache, rhinorrhea, nasal congestion, sore throat and cough.  Patient reports cough is occasionally productive of mucus.  He reports symptoms started on Friday and have been constant and not improving since onset, he is taken over-the-counter Alka-Seltzer cold and flu without improvement has not tried any other OTC meds, denies any other aggravating or alleviating symptoms.  Patient denies any chest pain or shortness of breath.  He reports some mild nausea, no episodes of vomiting, no abdominal pain or diarrhea.  Patient unsure if he has been exposed to the flu, but does report he did not have his flu shot this year.      History reviewed. No pertinent past medical history.  There are no active problems to display for this patient.   History reviewed. No pertinent surgical history.     Home Medications    Prior to Admission medications   Medication Sig Start Date End Date Taking? Authorizing Provider  guaiFENesin-codeine (ROBITUSSIN AC) 100-10 MG/5ML syrup Take 5 mLs by mouth 3 (three) times daily as needed for cough. 07/02/14   Reuben Likes, MD  HYDROcodone-acetaminophen (NORCO/VICODIN) 5-325 MG tablet Take 1 tablet by mouth every 4 (four) hours as needed for moderate pain or severe pain. 11/06/15   Trixie Dredge, PA-C  ibuprofen (ADVIL,MOTRIN) 800 MG tablet Take 1 tablet (800 mg total) by mouth every 8 (eight) hours as needed for mild pain or moderate pain. 11/06/15   Trixie Dredge, PA-C  ipratropium (ATROVENT) 0.06 % nasal spray Place 2 sprays into both nostrils 4 (four) times daily. 07/02/14   Reuben Likes, MD    Pseudoeph-Doxylamine-DM-APAP 60-12.02-08-999 MG/30ML LIQD Take 30 mLs by mouth daily as needed (for cold).    [provider]    Family History History reviewed. No pertinent family history.  Social History Social History   Tobacco Use  . Smoking status: Current Every Day Smoker    Packs/day: 0.25    Types: Cigarettes  . Smokeless tobacco: Never Used  Substance Use Topics  . Alcohol use: Yes    Alcohol/week: 1.8 oz    Types: 3 Cans of beer per week  . Drug use: Yes    Types: Marijuana     Allergies   Patient has no known allergies.   Review of Systems Review of Systems  Constitutional: Positive for chills and fever.  HENT: Positive for congestion, rhinorrhea and sore throat. Negative for ear discharge and ear pain.   Eyes: Negative for discharge, redness and itching.  Respiratory: Positive for cough. Negative for chest tightness, shortness of breath and wheezing.   Cardiovascular: Negative for chest pain and leg swelling.  Gastrointestinal: Positive for nausea. Negative for abdominal pain, blood in stool, diarrhea and vomiting.  Genitourinary: Negative for dysuria.  Musculoskeletal: Positive for arthralgias and myalgias.  Skin: Negative for color change and rash.  Neurological: Negative for dizziness, syncope and light-headedness.     Physical Exam Updated Vital Signs BP 126/82 (BP Location: Right Arm)   Pulse 73   Temp 98.1 F (36.7 C) (Oral)   Resp 18   SpO2 100%   Physical Exam  Constitutional: He  appears well-developed and well-nourished. No distress.  HENT:  Head: Normocephalic and atraumatic.  TMs clear with good landmarks, moderate nasal mucosa edema with clear rhinorrhea, posterior oropharynx clear and moist, with some erythema, no edema or exudates, uvula midline  Eyes: Right eye exhibits no discharge. Left eye exhibits no discharge.  Neck: Neck supple.  No rigidity  Cardiovascular: Normal rate, regular rhythm and normal heart sounds.   Pulmonary/Chest: Effort normal and breath sounds normal. No stridor. No respiratory distress. He has no wheezes. He has no rales.  Respirations equal and unlabored, patient able to speak in full sentences, lungs clear to auscultation bilaterally  Abdominal: Soft. Bowel sounds are normal. He exhibits no distension and no mass. There is no tenderness. There is no guarding.  Musculoskeletal: He exhibits no edema or deformity.  Neurological: He is alert. Coordination normal.  Skin: Skin is warm and dry. Capillary refill takes less than 2 seconds. He is not diaphoretic.  Psychiatric: He has a normal mood and affect. His behavior is normal.  Nursing note and vitals reviewed.    ED Treatments / Results  Labs (all labs ordered are listed, but only abnormal results are displayed) Labs Reviewed - No data to display  EKG  EKG Interpretation None       Radiology No results found.  Procedures Procedures (including critical care time)  Medications Ordered in ED Medications  ibuprofen (ADVIL,MOTRIN) tablet 600 mg (600 mg Oral Given 11/07/17 1459)     Initial Impression / Assessment and Plan / ED Course  I have reviewed the triage vital signs and the nursing notes.  Pertinent labs & imaging results that were available during my care of the patient were reviewed by me and considered in my medical decision making (see chart for details).  Patient with symptoms consistent with influenza.  Vitals are normal and patient is in no acute distress.  No signs of dehydration, tolerating PO's.   Lungs are clear.  Lungs clear to auscultation, no hypoxia or tachypnea doubt pneumonia. Discussed the cost versus benefit of Tamiflu treatment with the patient.  The patient understands that symptoms are greater than the recommended 24-48 hour window of treatment, no tamiflu provided.  Patient will be discharged with instructions to orally hydrate, rest, and use over-the-counter medications such as  anti-inflammatories ibuprofen and Aleve for muscle aches and Tylenol for fever.  Patient instructed to use over-the-counter cough syrups and lozenges, Zofran provided for nausea.  Return precautions discussed.  Patient to follow-up with primary care doctor.  Final Clinical Impressions(s) / ED Diagnoses   Final diagnoses:  Influenza    ED Discharge Orders        Ordered    ondansetron (ZOFRAN ODT) 4 MG disintegrating tablet     11/07/17 1546       Dartha LodgeFord, Hartlee Amedee N, [place]-C 11/07/17 1607    Abelino DerrickMackuen, Courteney Lyn, MD 11/10/17 [phone]

## 2017-11-07 NOTE — ED Triage Notes (Signed)
Pt reports sore throat, body aches, chills, headache starting Friday. Has taken OTC cold meds. No signs of distress

## 2017-11-07 NOTE — ED Notes (Signed)
See provider assessment 

## 2020-01-06 ENCOUNTER — Emergency Department (HOSPITAL_COMMUNITY)
Admission: EM | Admit: 2020-01-06 | Discharge: 2020-01-06 | Disposition: A | Discharge status: Home / Self Care | Payer: Self-pay | Attending: Emergency Medicine | Admitting: Emergency Medicine

## 2020-01-06 ENCOUNTER — Encounter (HOSPITAL_COMMUNITY): Payer: Self-pay | Admitting: Emergency Medicine

## 2020-01-06 ENCOUNTER — Other Ambulatory Visit: Payer: Self-pay

## 2020-01-06 ENCOUNTER — Emergency Department (HOSPITAL_COMMUNITY): Payer: Self-pay

## 2020-01-06 DIAGNOSIS — S80911A Unspecified superficial injury of right knee, initial encounter: Secondary | ICD-10-CM | POA: Insufficient documentation

## 2020-01-06 DIAGNOSIS — Y929 Unspecified place or not applicable: Secondary | ICD-10-CM | POA: Insufficient documentation

## 2020-01-06 DIAGNOSIS — F17210 Nicotine dependence, cigarettes, uncomplicated: Secondary | ICD-10-CM | POA: Insufficient documentation

## 2020-01-06 DIAGNOSIS — S8991XA Unspecified injury of right lower leg, initial encounter: Secondary | ICD-10-CM

## 2020-01-06 DIAGNOSIS — Y939 Activity, unspecified: Secondary | ICD-10-CM | POA: Insufficient documentation

## 2020-01-06 DIAGNOSIS — Y990 Civilian activity done for income or pay: Secondary | ICD-10-CM | POA: Insufficient documentation

## 2020-01-06 DIAGNOSIS — X503XXA Overexertion from repetitive movements, initial encounter: Secondary | ICD-10-CM | POA: Insufficient documentation

## 2020-01-06 IMAGING — CR DG KNEE COMPLETE 4+V*R*
4 series · 4 of 4 positions shown · non-contrast
Comparison: None.

CLINICAL DATA: Knee pain

EXAM:
RIGHT KNEE - COMPLETE 4+ VIEW

[knee ap]
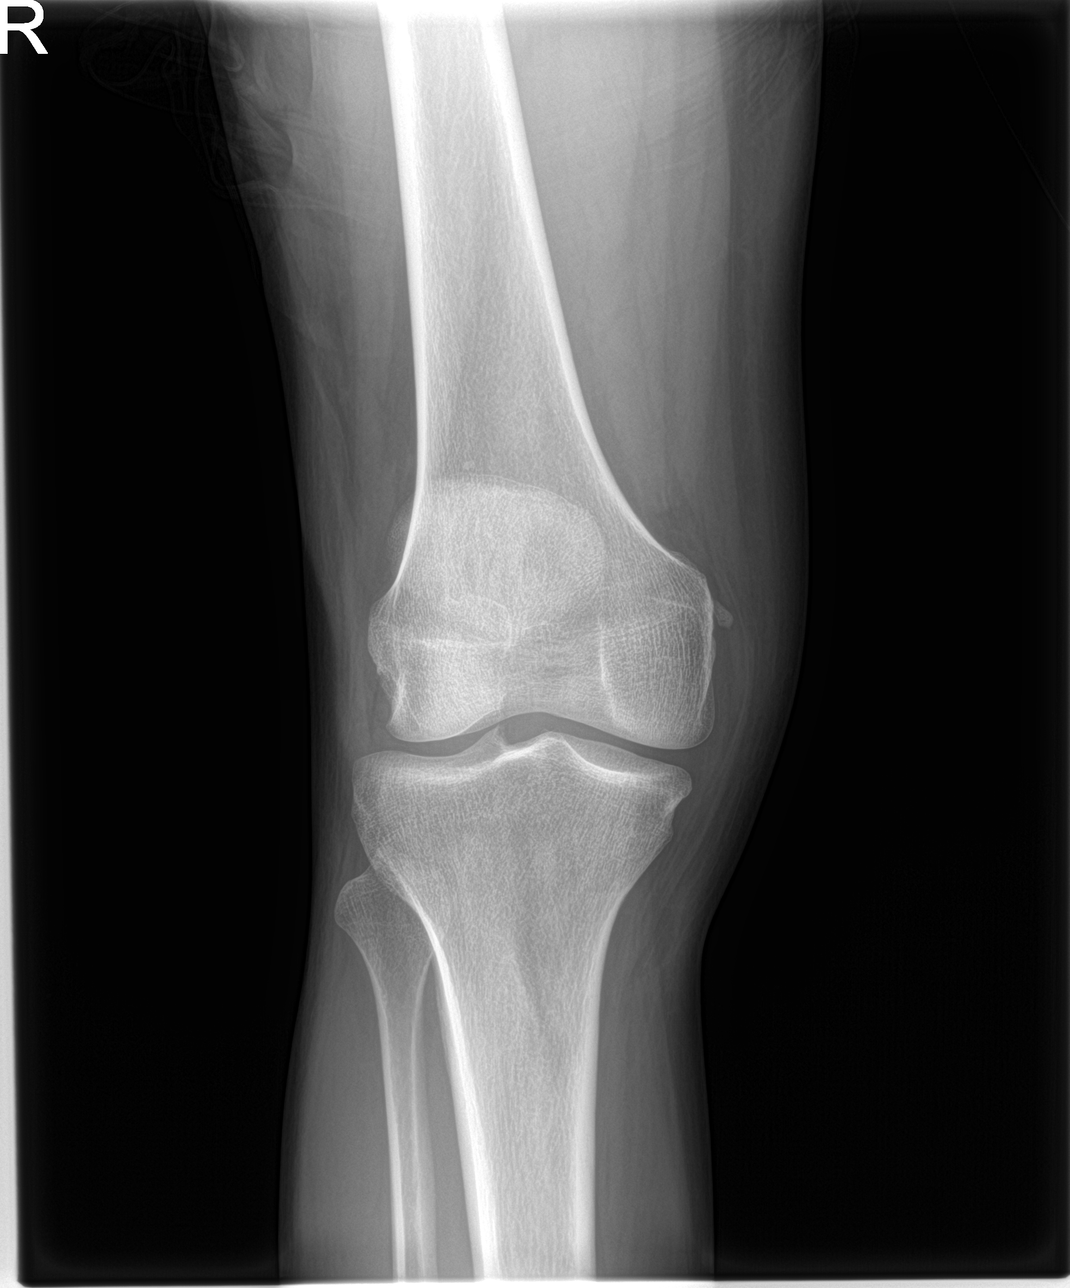

[knee lat]
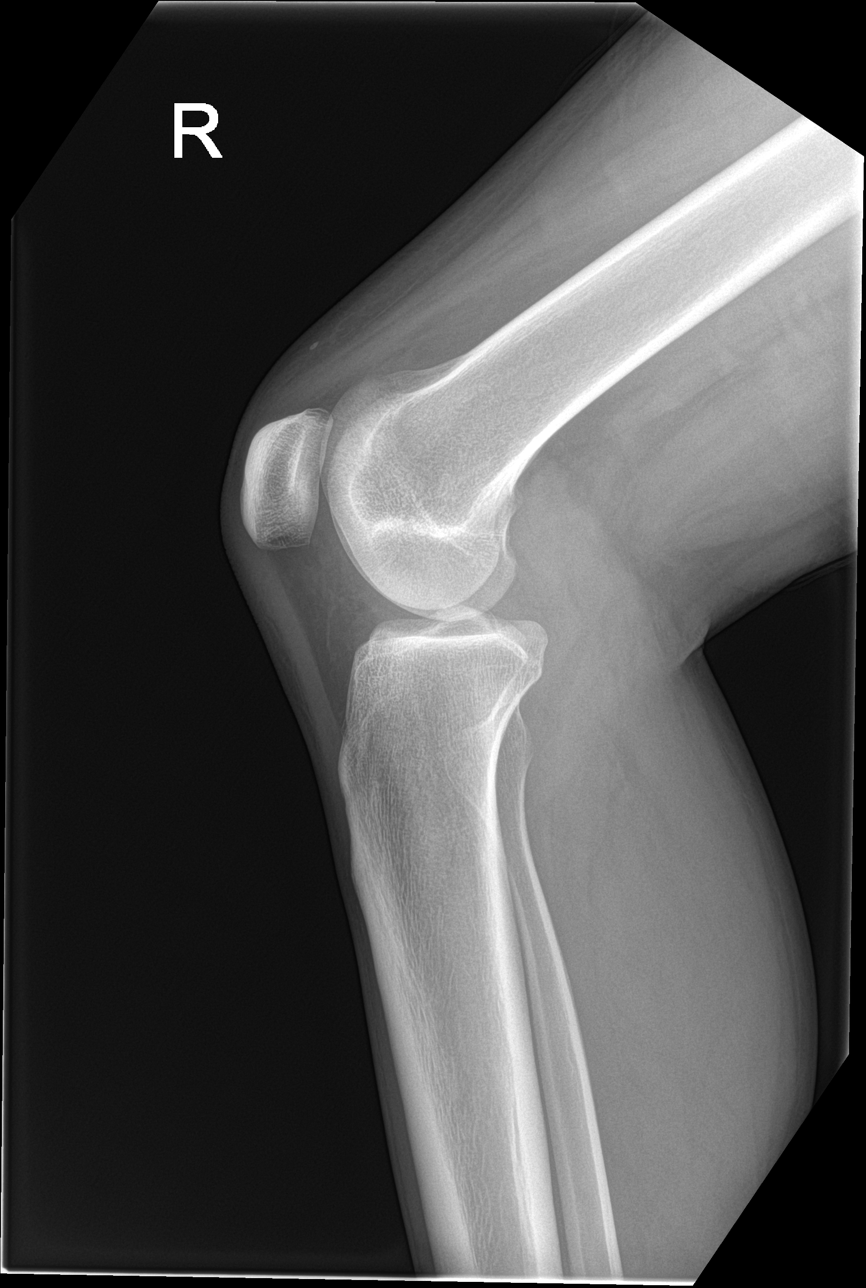

[knee obl (1 of 2)]
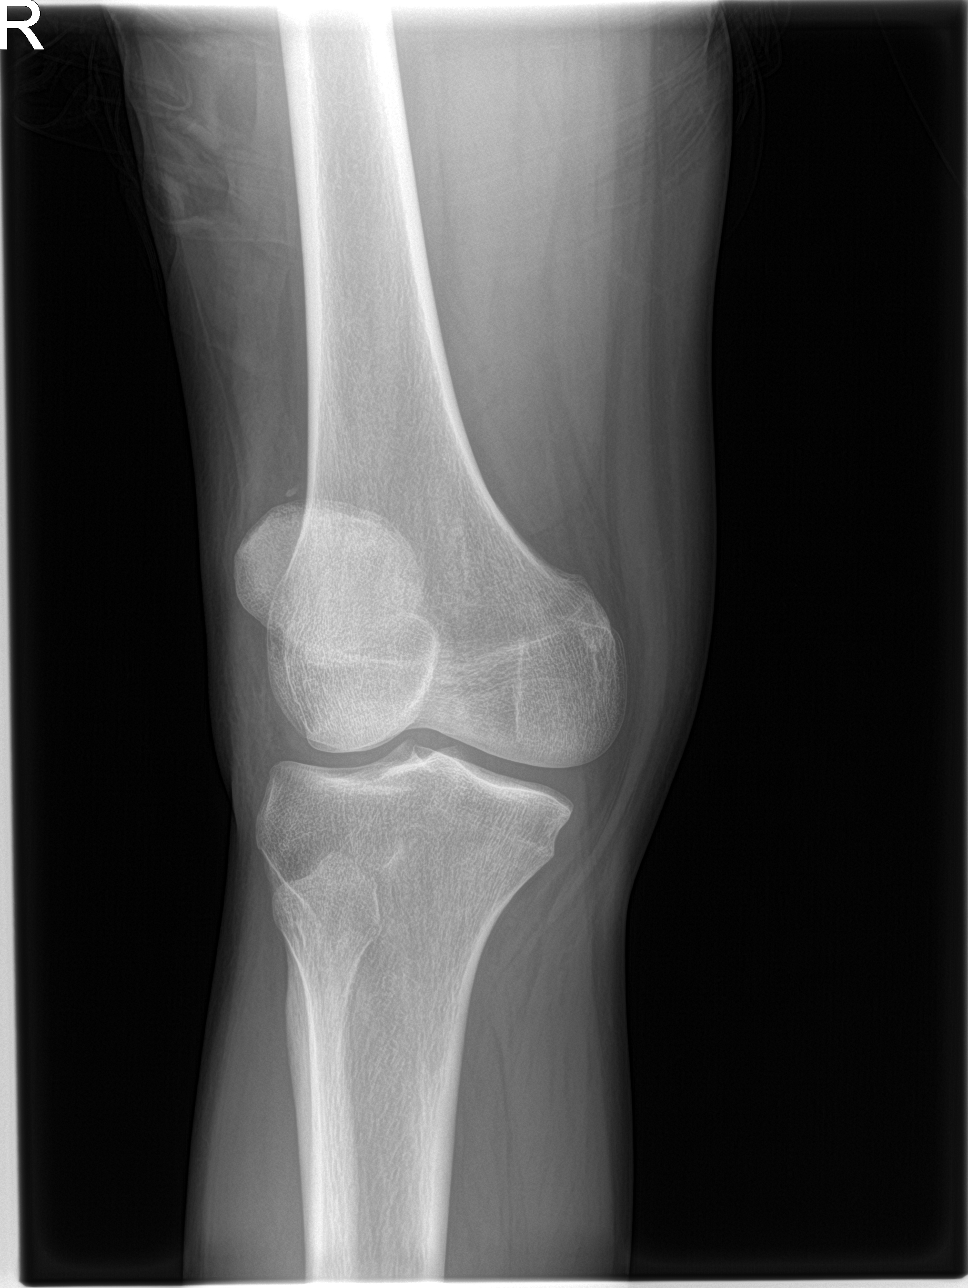

[knee obl (2 of 2)]
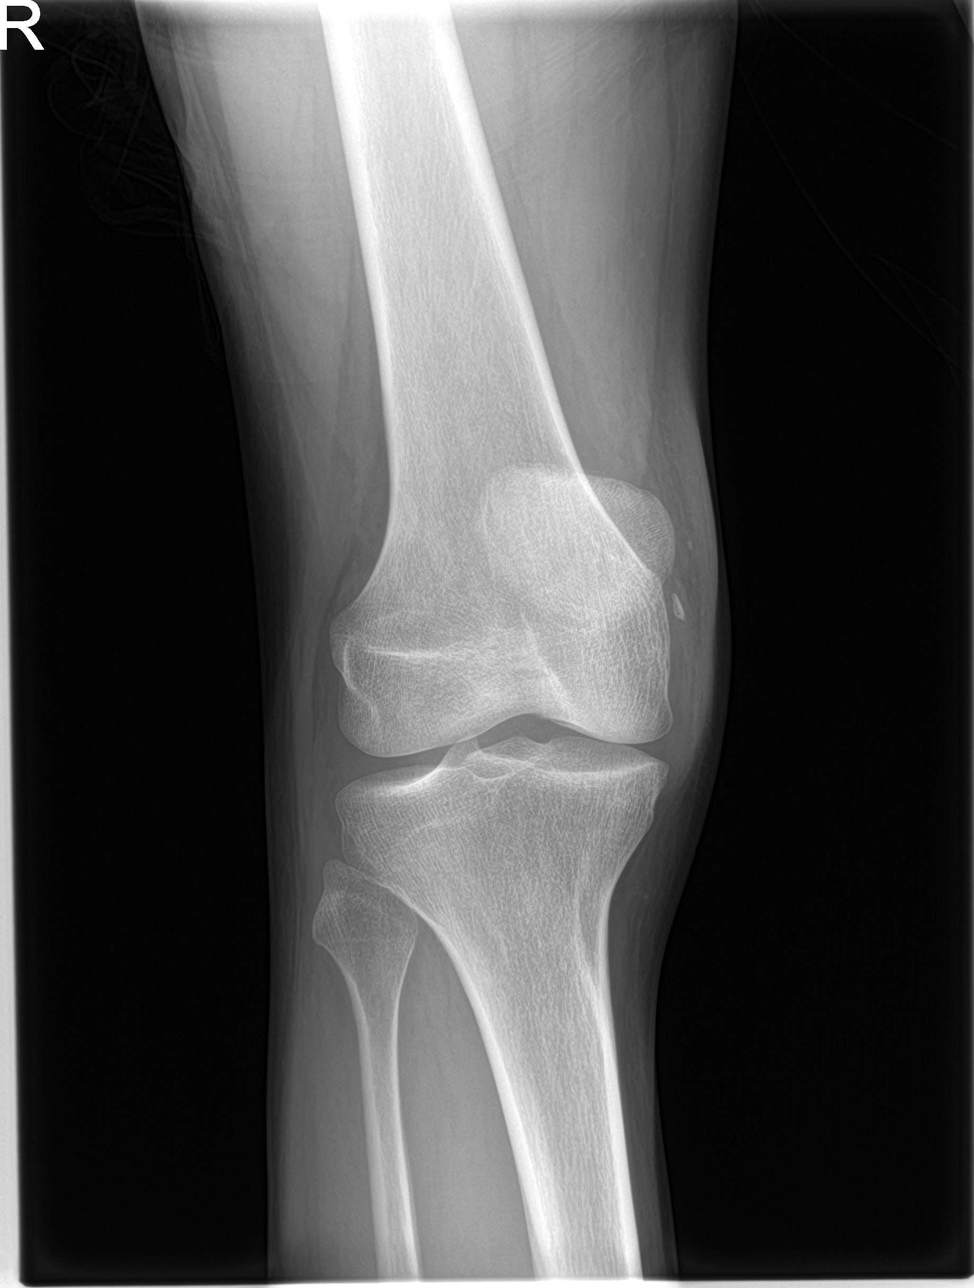

[4 of 4 positions shown; findings below may reference images not displayed]

FINDINGS: No definite acute displaced fracture or malalignment. Ovoid
calcification adjacent to the medial femoral condyle could relate to
age indeterminate ligamentous injury. The joint spaces are
maintained. Trace knee effusion.
IMPRESSION: 1. No acute displaced fracture is seen.
2. Ovoid calcification adjacent to the medial femoral condyle
suggests age indeterminate ligamentous injury.

## 2020-01-06 MED ORDER — NAPROXEN 250 MG PO TABS: 500.0000 mg | ORAL_TABLET | Freq: Two times a day (BID) | ORAL | 0 refills | Status: AC

## 2020-01-06 NOTE — Discharge Instructions (Signed)
Please read and follow all provided instructions.  Your diagnoses today include:  1. Injury of right knee, initial encounter     Tests performed today include:  An x-ray of the affected area - does NOT show any broken bones  Vital signs. See below for your results today.   Medications prescribed:   Naproxen - anti-inflammatory pain medication  Do not exceed 500mg  naproxen every 12 hours, take with food  You have been prescribed an anti-inflammatory medication or NSAID. Take with food. Take smallest effective dose for the shortest duration needed for your pain. Stop taking if you experience stomach pain or vomiting.   Take any prescribed medications only as directed.  Home care instructions:   Follow any educational materials contained in this packet  Follow R.I.C.E. Protocol:  R - rest your injury   I  - use ice on injury without applying directly to skin  C - compress injury with bandage or splint  E - elevate the injury as much as possible  Follow-up instructions: Please follow-up with your primary care provider or the provided orthopedic physician (bone specialist) if you continue to have significant pain in 1 week. In this case you may have a more severe injury that requires further care.   Return instructions:   Please return if your toes or feet are numb or tingling, appear gray or blue, or you have severe pain (also elevate the leg and loosen splint or wrap if you were given one)  Please return to the Emergency Department if you experience worsening symptoms.   Please return if you have any other emergent concerns.  Additional Information:  Your vital signs today were: BP 111/69 (BP Location: Left Arm)   Pulse 71   Temp 98.5 F (36.9 C) (Oral)   Resp 15   SpO2 100%  If your blood pressure (BP) was elevated above 135/85 this visit, please have this repeated by your doctor within one month. --------------

## 2020-01-06 NOTE — ED Triage Notes (Signed)
Pt arrives to ED from home with complaints of hurting his right knee last week. Patient states that he was twisting and that when the pain started.

## 2020-01-06 NOTE — ED Provider Notes (Signed)
[name] EMERGENCY DEPARTMENT Provider Note   CSN: [id number] Arrival date & time: 01/06/20  1610     History Chief Complaint  Patient presents with   Knee Pain    Randall Raymond is a 34 y.o. male.  Patient presents to the emergency department with complaint of right sided knee pain ongoing over the past week.  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  He now works at [employer] and does a lot of exertional activities on his feet.  He states that a week ago he was standing and had his foot planted and twisted causing exacerbation of knee pain.  He went to work the next day but had significant pain by the end of his shift.  He took a day off and returned but had to take another day off later in the week.  He denies any swelling of the knee.  No numbness or tingling distally.  He has been applying ice at home.  He does endorse some clicking and popping of the knee with motion.  No hip pain or ankle pain.        History reviewed. No pertinent past medical history.  There are no problems to display for this patient.   History reviewed. No pertinent surgical history.     History reviewed. No pertinent family history.  Social History   Tobacco Use   Smoking status: Current Every Day Smoker    Packs/day: 0.25    Types: Cigarettes   Smokeless tobacco: Never Used  Substance Use Topics   Alcohol use: Yes    Alcohol/week: 3.0 standard drinks    Types: 3 Cans of beer per week   Drug use: Yes    Types: Marijuana    Home Medications Prior to Admission medications   Medication Sig Start Date End Date Taking? Authorizing Provider  Pseudoeph-Doxylamine-DM-APAP 60-12.02-08-999 MG/30ML LIQD Take 30 mLs by mouth daily as needed (for cold).    [provider]  ipratropium (ATROVENT) 0.06 % nasal spray Place 2 sprays into both nostrils 4 (four) times daily. 07/02/14 01/06/20   Reuben Likes, MD    Allergies    Patient has no known allergies.  Review of Systems   Review of Systems  Constitutional: Negative for activity change.  Musculoskeletal: Positive for arthralgias. Negative for back pain, gait problem, joint swelling and neck pain.  Skin: Negative for wound.  Neurological: Negative for weakness and numbness.    Physical Exam Updated Vital Signs BP 111/69 (BP Location: Left Arm)    Pulse 71    Temp 98.5 F (36.9 C) (Oral)    Resp 15    SpO2 100%   Physical Exam Vitals and nursing note reviewed.  Constitutional:      Appearance: He is well-developed.  HENT:     Head: Normocephalic and atraumatic.  Eyes:     Conjunctiva/sclera: Conjunctivae normal.  Cardiovascular:     Pulses: Normal pulses. No decreased pulses.  Musculoskeletal:        General: Tenderness present.     Cervical back: Normal range of motion and neck supple.     Right hip: Normal.     Right knee: No effusion. Tenderness present over the medial joint line. No lateral joint line or patellar tendon tenderness. No LCL laxity or MCL laxity. Normal patellar mobility.     Right ankle: No swelling.  Skin:    General:  Skin is warm and dry.  Neurological:     Mental Status: He is alert.     Sensory: No sensory deficit.     Comments: Motor, sensation, and vascular distal to the injury is fully intact.      ED Results / Procedures / Treatments   Labs (all labs ordered are listed, but only abnormal results are displayed) Labs Reviewed - No data to display  EKG None  Radiology DG Knee Complete 4 Views Right  Result Date: 01/06/2020 CLINICAL DATA:  Knee pain EXAM: RIGHT KNEE - COMPLETE 4+ VIEW COMPARISON:  None. FINDINGS: No definite acute displaced fracture or malalignment. Ovoid calcification adjacent to the medial femoral condyle could relate to age indeterminate ligamentous injury. The joint spaces are maintained. Trace knee effusion. IMPRESSION: 1. No acute displaced fracture  is seen. 2. Ovoid calcification adjacent to the medial femoral condyle suggests age indeterminate ligamentous injury. Electronically Signed   By: Donavan Foil M.D.   On: 01/06/2020 16:41    Procedures Procedures (including critical care time)  Medications Ordered in ED Medications - No data to display  ED Course  I have reviewed the triage vital signs and the nursing notes.  Pertinent labs & imaging results that were available during my care of the patient were reviewed by me and considered in my medical decision making (see chart for details).   Patient seen and examined.  X-ray read reviewed.  Patient updated on results.  Questionable remote ligamentous injury.  History is concerning for possible meniscal injury.  Patient would likely benefit from following up with an orthopedist, referral given.  In the interim, will prescribe twice daily with naproxen and give a knee sleeve for stability.  Work note for tomorrow.  Discussed rice protocol.  Vital signs reviewed and are as follows: BP 111/69 (BP Location: Left Arm)    Pulse 71    Temp 98.5 F (36.9 C) (Oral)    Resp 15    SpO2 100%       MDM Rules/Calculators/A&P                      Patient with ongoing knee pain, worse after recent injury.  I doubt ligamentous injury given stability of the knee without large effusion.  Mechanism and history of clicking and popping is concerning for meniscal injury.  At any rate, patient would benefit from RICE, NSAIDs.  Encouraged orthopedic follow-up.  Lower extremitity is neurovascularly intact at time of exam.   Final Clinical Impression(s) / ED Diagnoses Final diagnoses:  Injury of right knee, initial encounter    Rx / DC Orders ED Discharge Orders         Ordered    naproxen (NAPROSYN) 250 MG tablet  2 times daily with meals     01/06/20 1813           Carlisle Cater, PA-C 01/06/20 1820    Lajean Saver, MD 01/06/20 [phone]

## 2020-01-06 NOTE — ED Notes (Signed)
Pt verbalizes discharge instructions and left emergency department NAD

## 2020-09-24 ENCOUNTER — Emergency Department (HOSPITAL_COMMUNITY): Payer: Self-pay

## 2020-09-24 ENCOUNTER — Emergency Department (HOSPITAL_COMMUNITY)
Admission: EM | Admit: 2020-09-24 | Discharge: 2020-09-24 | Disposition: A | Discharge status: Home / Self Care | Payer: Self-pay | Attending: Emergency Medicine | Admitting: Emergency Medicine

## 2020-09-24 DIAGNOSIS — Z87891 Personal history of nicotine dependence: Secondary | ICD-10-CM

## 2020-09-24 DIAGNOSIS — J189 Pneumonia, unspecified organism: Secondary | ICD-10-CM

## 2020-09-24 DIAGNOSIS — Z20822 Contact with and (suspected) exposure to covid-19: Secondary | ICD-10-CM | POA: Insufficient documentation

## 2020-09-24 DIAGNOSIS — J188 Other pneumonia, unspecified organism: Secondary | ICD-10-CM | POA: Insufficient documentation

## 2020-09-24 DIAGNOSIS — F17210 Nicotine dependence, cigarettes, uncomplicated: Secondary | ICD-10-CM | POA: Insufficient documentation

## 2020-09-24 LAB — BASIC METABOLIC PANEL
Anion gap: 11 (ref 5–15)
BUN: 8 mg/dL (ref 6–20)
CO2: 23 mmol/L (ref 22–32)
Calcium: 8.9 mg/dL (ref 8.9–10.3)
Chloride: 101 mmol/L (ref 98–111)
Creatinine, Ser: 0.9 mg/dL (ref 0.61–1.24)
GFR, Estimated: >60 mL/min (ref >60)
Glucose, Bld: 172 mg/dL — ABNORMAL HIGH (ref 70–99)
Potassium: 3.9 mmol/L (ref 3.5–5.1)
Sodium: 135 mmol/L (ref 135–145)

## 2020-09-24 LAB — CBC
HCT: 39.6 % (ref 39.0–52.0)
Hemoglobin: 13.1 g/dL (ref 13.0–17.0)
MCH: 29.4 pg (ref 26.0–34.0)
MCHC: 33.1 g/dL (ref 30.0–36.0)
MCV: 89 fL (ref 80.0–100.0)
Platelets: 390 10*3/uL (ref 150–400)
RBC: 4.45 MIL/uL (ref 4.22–5.81)
RDW: 12.9 % (ref 11.5–15.5)
WBC: 12.1 10*3/uL — ABNORMAL HIGH (ref 4.0–10.5)
nRBC: 0 % (ref 0.0–0.2)

## 2020-09-24 LAB — SARS CORONAVIRUS 2 (TAT 6-24 HRS): SARS Coronavirus 2: NEGATIVE

## 2020-09-24 IMAGING — CR DG CHEST 2V
2 series · 2 of 2 positions shown · non-contrast
Comparison: 02/03/2003

CLINICAL DATA: 35-year-old male with a history dyspnea

EXAM:
CHEST - 2 VIEW

[chest pa]
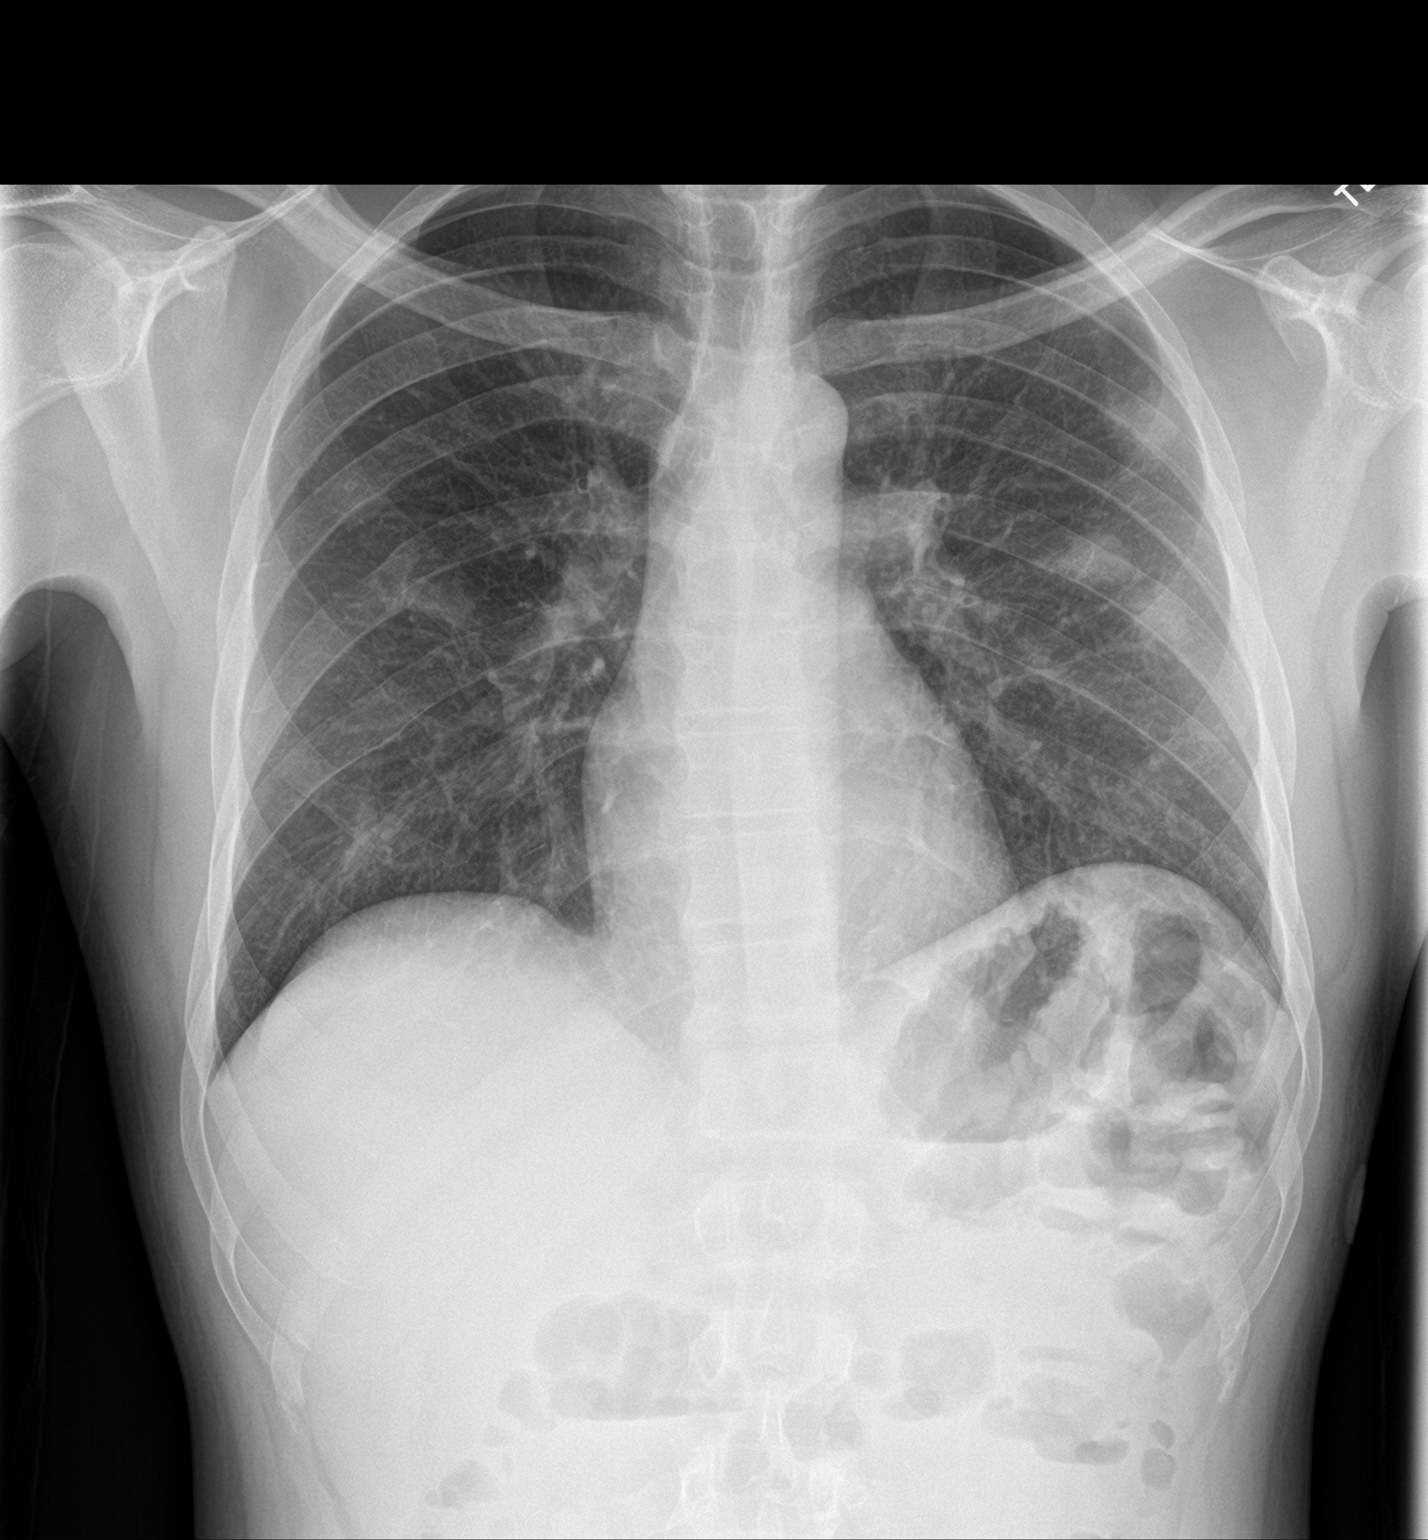

[chest lat]
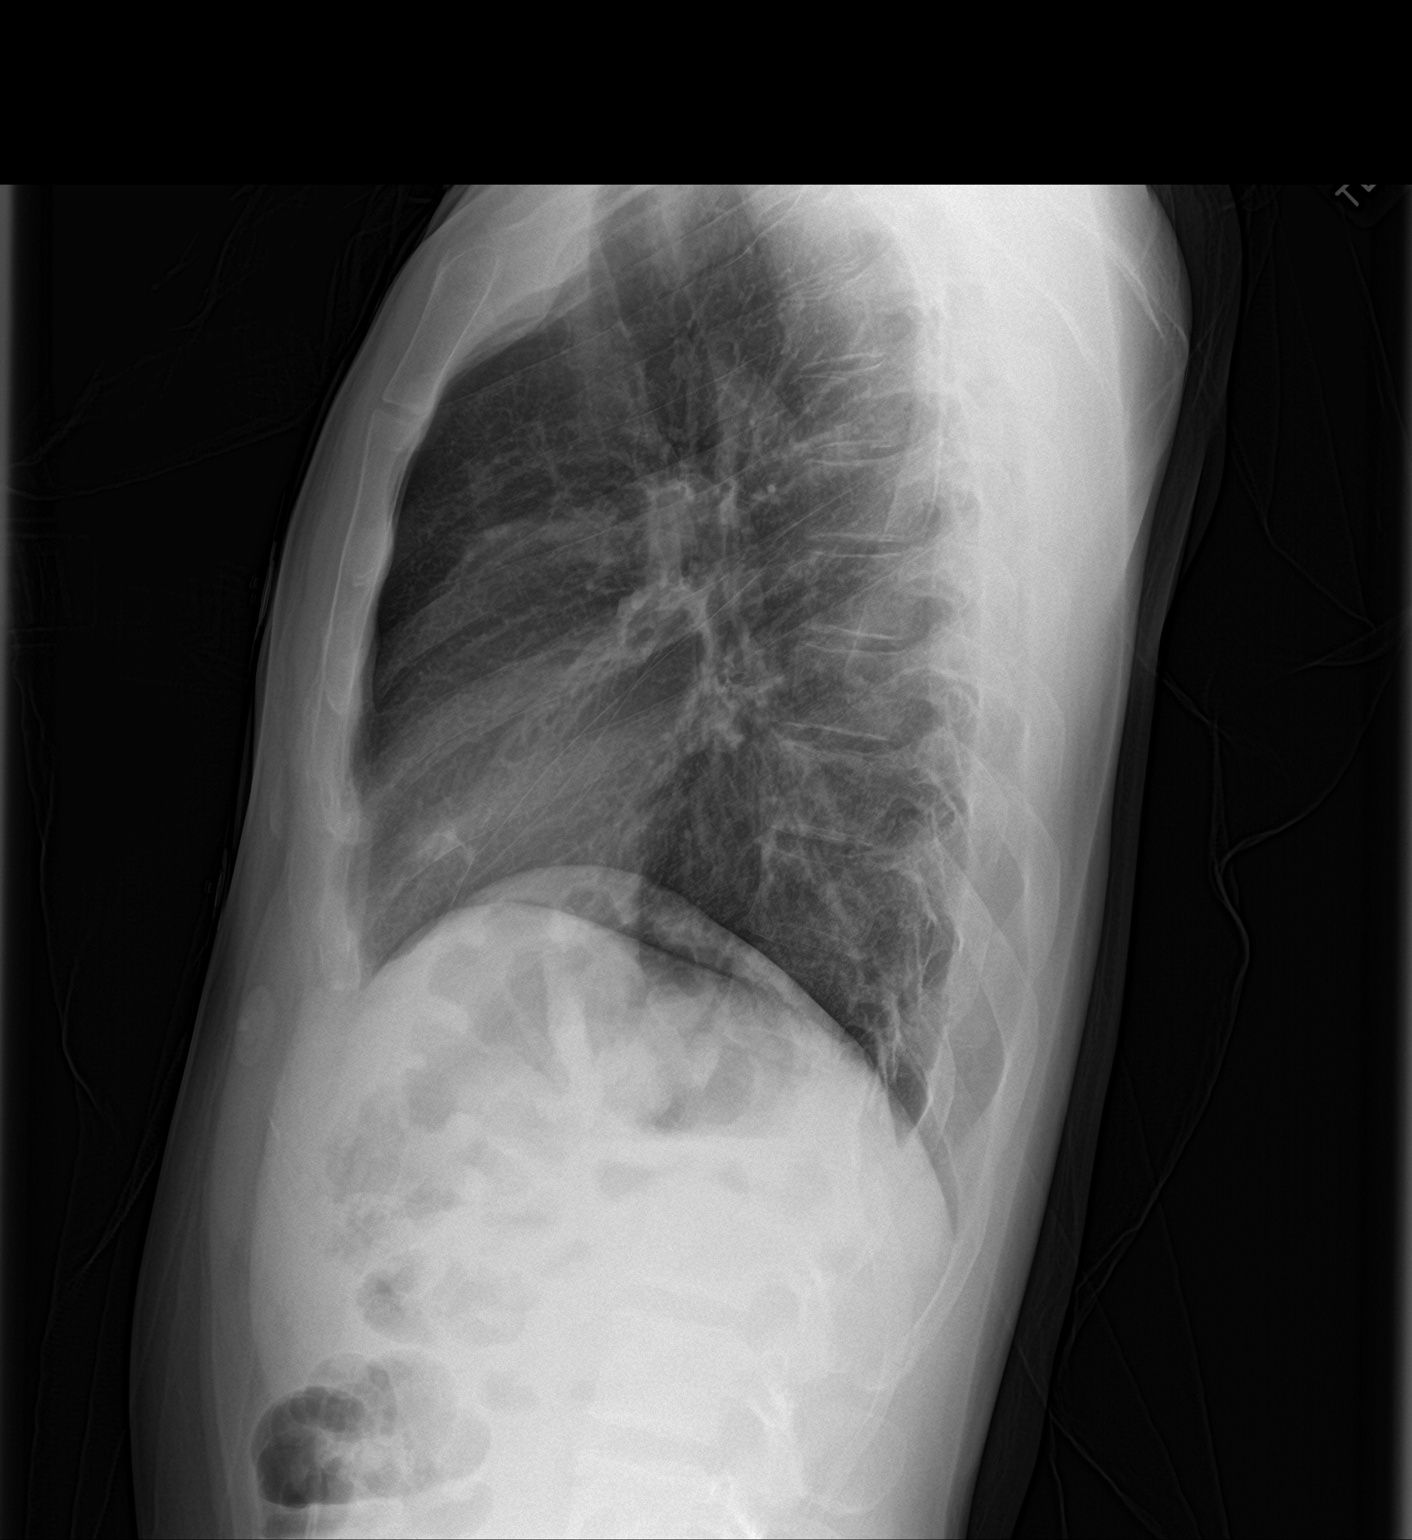

[2 of 2 positions shown; findings below may reference images not displayed]

FINDINGS: Cardiomediastinal silhouette within normal limits in size and
contour. No evidence of central vascular congestion. No
pneumothorax. No pleural effusion.

Reticulonodular densities of the bilateral lungs.

No displaced fracture
IMPRESSION: Reticulonodular opacities compatible with multifocal pneumonia

## 2020-09-24 MED ORDER — AZITHROMYCIN 250 MG PO TABS: ORAL_TABLET | ORAL | 0 refills | Status: AC

## 2020-09-24 MED ORDER — AMOXICILLIN-POT CLAVULANATE 875-125 MG PO TABS: 1.0000 | ORAL_TABLET | Freq: Two times a day (BID) | ORAL | 0 refills | Status: AC

## 2020-09-24 NOTE — ED Provider Notes (Signed)
[facility] EMERGENCY DEPARTMENT Provider Note   CSN: [id number] Arrival date & time: 09/24/20  0931     History Chief Complaint  Patient presents with  . Shortness of Breath    Randall Raymond is a 35 y.o. male.  Patient presents with congestion, shortness of breath and intermittent sharp pain in his lungs for the past 6 days.  No history of blood clots, patient uses cigarettes and recently started vaping for which she associated causing the worsening of his symptoms.  No fevers or chills.  Patient tolerating oral liquids.  Patient has been out of work.  Patient has no heart history, no leg swelling, no recent surgeries.        No past medical history on file.  There are no problems to display for this patient.   No past surgical history on file.     No family history on file.  Social History   Tobacco Use  . Smoking status: Current Every Day Smoker    Packs/day: 0.25    Types: Cigarettes  . Smokeless tobacco: Never Used  Substance Use Topics  . Alcohol use: Yes    Alcohol/week: 3.0 standard drinks    Types: 3 Cans of beer per week  . Drug use: Yes    Types: Marijuana    Home Medications Prior to Admission medications   Medication Sig Start Date End Date Taking? Authorizing Provider  amoxicillin-clavulanate (AUGMENTIN) 875-125 MG tablet Take 1 tablet by mouth 2 (two) times daily. One po bid x 7 days 09/24/20  Yes Blane Ohara, MD  azithromycin (ZITHROMAX Z-PAK) 250 MG tablet 2 po day one, then 1 daily x 4 days 09/24/20  Yes Blane Ohara, MD  naproxen (NAPROSYN) 250 MG tablet Take 2 tablets (500 mg total) by mouth 2 (two) times daily with a meal. 01/06/20   Renne Crigler, PA-C  Pseudoeph-Doxylamine-DM-APAP 60-12.02-08-999 MG/30ML LIQD Take 30 mLs by mouth daily as needed (for cold).    [provider]  ipratropium (ATROVENT) 0.06 % nasal spray Place 2 sprays into both nostrils 4 (four) times daily. 07/02/14 01/06/20  Reuben Likes,  MD    Allergies    Patient has no known allergies.  Review of Systems   Review of Systems  Constitutional: Negative for chills and fever.  HENT: Positive for congestion.   Eyes: Negative for visual disturbance.  Respiratory: Positive for cough and shortness of breath.   Cardiovascular: Negative for chest pain.  Gastrointestinal: Negative for abdominal pain and vomiting.  Genitourinary: Negative for dysuria and flank pain.  Musculoskeletal: Negative for back pain, neck pain and neck stiffness.  Skin: Negative for rash.  Neurological: Negative for light-headedness and headaches.    Physical Exam Updated Vital Signs BP 116/78   Pulse 95   Temp 98.5 F (36.9 C) (Oral)   Resp 14   Ht 5\' 7"  (1.702 m)   Wt 68 kg   SpO2 99%   BMI 23.49 kg/m   Physical Exam Vitals and nursing note reviewed.  Constitutional:      Appearance: He is well-developed and well-nourished.  HENT:     Head: Normocephalic and atraumatic.  Eyes:     General:        Right eye: No discharge.        Left eye: No discharge.     Conjunctiva/sclera: Conjunctivae normal.  Neck:     Trachea: No tracheal deviation.  Cardiovascular:     Rate and Rhythm: Normal rate and regular  rhythm.  Pulmonary:     Effort: Pulmonary effort is normal.     Breath sounds: Examination of the right-lower field reveals rales. Examination of the left-lower field reveals rales. Rales present.  Abdominal:     General: There is no distension.     Palpations: Abdomen is soft.     Tenderness: There is no abdominal tenderness. There is no guarding.  Musculoskeletal:        General: No edema.     Cervical back: Normal range of motion and neck supple.     Right lower leg: No edema.     Left lower leg: No edema.  Skin:    General: Skin is warm.     Findings: No rash.  Neurological:     Mental Status: He is alert and oriented to person, place, and time.  Psychiatric:        Mood and Affect: Mood and affect normal.     ED  Results / Procedures / Treatments   Labs (all labs ordered are listed, but only abnormal results are displayed) Labs Reviewed  BASIC METABOLIC PANEL - Abnormal; Notable for the following components:      Result Value   Glucose, Bld 172 (*)    All other components within normal limits  CBC - Abnormal; Notable for the following components:   WBC 12.1 (*)    All other components within normal limits  SARS CORONAVIRUS 2 (TAT 6-24 HRS)    EKG None  Radiology DG Chest 2 View  Result Date: 09/24/2020 CLINICAL DATA:  35 year old male with a history dyspnea EXAM: CHEST - 2 VIEW COMPARISON:  02/03/2003 FINDINGS: Cardiomediastinal silhouette within normal limits in size and contour. No evidence of central vascular congestion. No pneumothorax. No pleural effusion. Reticulonodular densities of the bilateral lungs. No displaced fracture IMPRESSION: Reticulonodular opacities compatible with multifocal pneumonia Electronically Signed   By: Gilmer Mor D.O.   On: 09/24/2020 10:05    Procedures Procedures (including critical care time)  Medications Ordered in ED Medications - No data to display  ED Course  I have reviewed the triage vital signs and the nursing notes.  Pertinent labs & imaging results that were available during my care of the patient were reviewed by me and considered in my medical decision making (see chart for details).    MDM Rules/Calculators/A&P                          Patient presents with clinical concern for pneumonia versus side effects of vaping which may include pulmonary hemorrhage/pneumonia/other versus COVID versus other concerns. Patient has no blood clot risk factors.  Discussed in detail and educated on cigarette cessation and vaping cessation, we discussed this for 5 minutes and patient says he is quitting. Patient understands reasons to return.  Blood work was obtained overall reassuring except for mild leukocytosis 12.1.  Chest x-ray reviewed showing  multifocal pneumonia. COVID test sent and discussed if positive patient likely can stop antibiotics. Patient has normal oxygenation and normal work of breathing in the ER.  Karson Sewell was evaluated in Emergency Department on 09/24/2020 for the symptoms described in the history of present illness. He was evaluated in the context of the global COVID-19 pandemic, which necessitated consideration that the patient might be at risk for infection with the SARS-CoV-2 virus that causes COVID-19. Institutional protocols and algorithms that pertain to the evaluation of patients at risk for COVID-19 are in a state of  rapid change based on information released by regulatory bodies including the CDC and federal and state organizations. These policies and algorithms were followed during the patient's care in the ED.   Final Clinical Impression(s) / ED Diagnoses Final diagnoses:  Multifocal pneumonia  History of nicotine vaping    Rx / DC Orders ED Discharge Orders         Ordered    amoxicillin-clavulanate (AUGMENTIN) 875-125 MG tablet  2 times daily        09/24/20 1132    azithromycin (ZITHROMAX Z-PAK) 250 MG tablet        09/24/20 1132           Blane Ohara, MD 09/24/20 1136

## 2020-09-24 NOTE — ED Triage Notes (Addendum)
Patient complains of sharp pain with inspiration that started six days ago. Denies body aches, fever, denies any other symptoms. Patient states he thinks he came on while he as using a vape pen so he stopped vaping but the pain did not stop so he came to get evaluated.

## 2020-09-24 NOTE — Discharge Instructions (Addendum)
Take antibiotics as prescribed. Stop vaping and avoid nicotine. Follow-up COVID test result in the next 24 hours on MyChart, you should be called if it is abnormal as well. If COVID test is positive you can likely stop antibiotics. Return for worsening shortness of breath or new concerns. Isolate as discussed.

## 2020-10-05 ENCOUNTER — Other Ambulatory Visit: Payer: Self-pay

## 2021-03-18 ENCOUNTER — Encounter (HOSPITAL_COMMUNITY): Payer: Self-pay | Admitting: Emergency Medicine

## 2021-03-18 ENCOUNTER — Emergency Department (HOSPITAL_COMMUNITY)
Admission: EM | Admit: 2021-03-18 | Discharge: 2021-03-18 | Disposition: A | Discharge status: Home / Self Care | Payer: BLUE CROSS/BLUE SHIELD | Attending: Emergency Medicine | Admitting: Emergency Medicine

## 2021-03-18 DIAGNOSIS — R519 Headache, unspecified: Secondary | ICD-10-CM | POA: Insufficient documentation

## 2021-03-18 DIAGNOSIS — R5383 Other fatigue: Secondary | ICD-10-CM | POA: Insufficient documentation

## 2021-03-18 DIAGNOSIS — Z5321 Procedure and treatment not carried out due to patient leaving prior to being seen by health care provider: Secondary | ICD-10-CM | POA: Diagnosis not present

## 2021-03-18 DIAGNOSIS — R531 Weakness: Secondary | ICD-10-CM | POA: Insufficient documentation

## 2021-03-18 NOTE — ED Provider Notes (Signed)
Emergency Medicine Provider Triage Evaluation Note  Randall Raymond , a 35 y.o. male  was evaluated in triage.  Pt complains of 2 to 3-day history of low energy and diarrhea.  Denies any chest pain or shortness of breath.  Negative COVID test recently.  Review of Systems  Positive: Decreased energy and diarrhea Negative: Chest pain, shortness of breath  Physical Exam  BP 116/83 (BP Location: Left Arm)   Pulse 81   Temp 98.2 F (36.8 C) (Oral)   Resp 14   SpO2 100%  Gen:   Awake, no distress   Resp:  Normal effort  MSK:   Moves extremities without difficulty  Other:  No signs of respiratory distress  Medical Decision Making  Medically screening exam initiated at 1:08 PM.  Appropriate orders placed.  Randall Raymond was informed that the remainder of the evaluation will be completed by another provider, this initial triage assessment does not replace that evaluation, and the importance of remaining in the ED until their evaluation is complete.  Will likely need symptom control   Randall Pates, PA-C 03/18/21 1309    Randall Bale, MD 03/20/21 [phone]

## 2021-03-18 NOTE — ED Triage Notes (Signed)
Patient complains of fatigue, headaches, and weakness that started on Monday. Patient is vaccinated against COVID, states he was tested Monday and yesterday for COVID and was negative both times. Patient alert, oriented, ambulatory, and in no apparent distress at this time.

## 2021-03-18 NOTE — ED Notes (Signed)
Called pt 3x no answer moving off the floor 

## 2023-10-13 ENCOUNTER — Emergency Department (HOSPITAL_COMMUNITY)
Admission: EM | Admit: 2023-10-13 | Discharge: 2023-10-13 | Discharge status: Against Medical Advice | Payer: Self-pay | Attending: Emergency Medicine | Admitting: Emergency Medicine

## 2023-10-13 ENCOUNTER — Other Ambulatory Visit: Payer: Self-pay

## 2023-10-13 ENCOUNTER — Encounter (HOSPITAL_COMMUNITY): Payer: Self-pay

## 2023-10-13 DIAGNOSIS — F10929 Alcohol use, unspecified with intoxication, unspecified: Secondary | ICD-10-CM | POA: Insufficient documentation

## 2023-10-13 DIAGNOSIS — Z5329 Procedure and treatment not carried out because of patient's decision for other reasons: Secondary | ICD-10-CM | POA: Insufficient documentation

## 2023-10-13 NOTE — Discharge Instructions (Addendum)
You are seen today for alcohol intoxication after being found unconscious.  Suspicious that other drugs may be present in your system, however due to you wanting to leave we cannot keep you.  For this reason I recommend that you continue to monitor your symptoms and if you begin develop new or worsening symptoms return to the ED for further evaluation.

## 2023-10-13 NOTE — ED Triage Notes (Signed)
Pt BIBEMS, as per report pt was found on the street unresponsive. Pt admitted to ETOH use tonight. As per EMS report pt had pinpoint pupils, 1 narcan given by EMS

## 2023-10-13 NOTE — ED Notes (Signed)
Pt refusing all treatment, N. Pickering MD notified. Pt's IV remove.

## 2023-10-13 NOTE — ED Provider Notes (Signed)
[place] EMERGENCY DEPARTMENT AT [facility] Provider Note   CSN: [id number] Arrival date & time: 10/13/23  2041     History  Chief Complaint  Patient presents with   Alcohol Intoxication    Randall Raymond is a 38 y.o. male.   Alcohol Intoxication  Patient is a 38 year old male presents the ED today complaining of loss of consciousness after drinking with friends.  States he ordered 1/5 of liquor while at his daughters play and was drinking with friends when he woke up with EMS.  He was agitated on arrival and emotionally upset that his friends left him.  EMS says that they provided Narcan where he woke up.  Denies nausea, vomiting     Home Medications Prior to Admission medications   Medication Sig Start Date End Date Taking? Authorizing Provider  amoxicillin-clavulanate (AUGMENTIN) 875-125 MG tablet Take 1 tablet by mouth 2 (two) times daily. One po bid x 7 days 09/24/20   Blane Ohara, MD  azithromycin (ZITHROMAX Z-PAK) 250 MG tablet 2 po day one, then 1 daily x 4 days 09/24/20   Blane Ohara, MD  naproxen (NAPROSYN) 250 MG tablet Take 2 tablets (500 mg total) by mouth 2 (two) times daily with a meal. 01/06/20   Renne Crigler, PA-C  Pseudoeph-Doxylamine-DM-APAP 60-12.02-08-999 MG/30ML LIQD Take 30 mLs by mouth daily as needed (for cold).    [provider]  ipratropium (ATROVENT) 0.06 % nasal spray Place 2 sprays into both nostrils 4 (four) times daily. 07/02/14 01/06/20  Reuben Likes, MD      Allergies    Patient has no known allergies.    Review of Systems   Review of Systems  Neurological:  Positive for syncope.  All other systems reviewed and are negative.   Physical Exam Updated Vital Signs Ht 5\' 7"  (1.702 m)   Wt 68 kg   BMI 23.48 kg/m  Physical Exam Vitals and nursing note reviewed.  Constitutional:      General: He is not in acute distress.    Appearance: Normal appearance.  Pulmonary:     Effort: No respiratory distress.   Neurological:     Mental Status: He is alert.  Psychiatric:     Comments: Patient was initially very agitated however was calm upon further discussion and expresses desire to leave without being treated.     ED Results / Procedures / Treatments   Labs (all labs ordered are listed, but only abnormal results are displayed) Labs Reviewed  CBC WITH DIFFERENTIAL/PLATELET  COMPREHENSIVE METABOLIC PANEL  ETHANOL  RAPID URINE DRUG SCREEN, HOSP PERFORMED    EKG None  Radiology No results found.  Procedures Procedures    Medications Ordered in ED Medications - No data to display  ED Course/ Medical Decision Making/ A&P                                 Medical Decision Making Amount and/or Complexity of Data Reviewed Labs: ordered.   This patient is a 38 year old male who presents to the ED for concern of alcohol intoxication and LOC.  He was found unconscious by EMS who gave Narcan and woke him up.  Patient was agitated upon arrival and emotional.  He did not wish to stay.  He was alert and oriented x 4.  Upon further conversation patient was able to calm down and expressed his desire to leave and find people who "left him."  Differential diagnoses prior to evaluation: Alcohol intoxication, opioid intoxication, metabolic abnormality, arrhythmia  Past Medical History / Social History / Additional history: Chart reviewed. Pertinent results include: No known chronic medical history on file, has eloped from the ED before.  Medications / Treatment: No treatments were provided due to patient wanting to leave.  Patient was alert and oriented and expressed desire to leave and find people who "left him there."   Disposition: After consideration of the diagnostic results and the patients response to treatment, I feel that the patient would benefit from staying and being further evaluated for possible drug overdose or other underlying etiology however due to patient wishing to leave,  patient was discharged AMA.   Final Clinical Impression(s) / ED Diagnoses Final diagnoses:  Alcoholic intoxication with complication [facility])    Rx / DC Orders ED Discharge Orders     None         Lavonia Drafts 10/13/23 2149    Benjiman Core, MD 10/13/23 [phone]
